# Patient Record
Sex: Female | Born: 1995 | Race: Black or African American | Hispanic: No | Marital: Single | Smoking: Current every day smoker
Health system: Southern US, Community
[De-identification: ages and names within clinical notes are randomized; demographics above are authoritative.]

## PROBLEM LIST (undated history)

## (undated) ENCOUNTER — Inpatient Hospital Stay (HOSPITAL_COMMUNITY): Payer: Self-pay

## (undated) DIAGNOSIS — I10 Essential (primary) hypertension: Secondary | ICD-10-CM

## (undated) DIAGNOSIS — O039 Complete or unspecified spontaneous abortion without complication: Secondary | ICD-10-CM

## (undated) DIAGNOSIS — Z8619 Personal history of other infectious and parasitic diseases: Secondary | ICD-10-CM

## (undated) DIAGNOSIS — O24419 Gestational diabetes mellitus in pregnancy, unspecified control: Secondary | ICD-10-CM

## (undated) DIAGNOSIS — D649 Anemia, unspecified: Secondary | ICD-10-CM

## (undated) HISTORY — PX: NO PAST SURGERIES: SHX2092

## (undated) HISTORY — PX: POLYPECTOMY: SHX149

## (undated) HISTORY — DX: Personal history of other infectious and parasitic diseases: Z86.19

---

## 2015-01-29 ENCOUNTER — Encounter (HOSPITAL_COMMUNITY): Payer: Self-pay | Admitting: Emergency Medicine

## 2015-01-29 ENCOUNTER — Emergency Department (HOSPITAL_COMMUNITY): Payer: Managed Care, Other (non HMO)

## 2015-01-29 ENCOUNTER — Emergency Department (HOSPITAL_COMMUNITY)
Admission: EM | Admit: 2015-01-29 | Discharge: 2015-01-29 | Disposition: A | Discharge status: Home / Self Care | Payer: Managed Care, Other (non HMO) | Attending: Emergency Medicine | Admitting: Emergency Medicine

## 2015-01-29 DIAGNOSIS — Z3A01 Less than 8 weeks gestation of pregnancy: Secondary | ICD-10-CM | POA: Diagnosis not present

## 2015-01-29 DIAGNOSIS — R011 Cardiac murmur, unspecified: Secondary | ICD-10-CM | POA: Insufficient documentation

## 2015-01-29 DIAGNOSIS — O200 Threatened abortion: Secondary | ICD-10-CM | POA: Diagnosis not present

## 2015-01-29 DIAGNOSIS — O209 Hemorrhage in early pregnancy, unspecified: Secondary | ICD-10-CM | POA: Diagnosis present

## 2015-01-29 DIAGNOSIS — N939 Abnormal uterine and vaginal bleeding, unspecified: Secondary | ICD-10-CM

## 2015-01-29 HISTORY — DX: Complete or unspecified spontaneous abortion without complication: O03.9

## 2015-01-29 LAB — OB RESULTS CONSOLE ABO/RH: RH TYPE: POSITIVE

## 2015-01-29 LAB — HCG, QUANTITATIVE, PREGNANCY: HCG, BETA CHAIN, QUANT, S: 10288 m[IU]/mL — AB (ref <5)

## 2015-01-29 LAB — CBC
HCT: 32.4 % — ABNORMAL LOW (ref 36.0–46.0)
Hemoglobin: 10 g/dL — ABNORMAL LOW (ref 12.0–15.0)
MCH: 21.5 pg — AB (ref 26.0–34.0)
MCHC: 30.9 g/dL (ref 30.0–36.0)
MCV: 69.7 fL — ABNORMAL LOW (ref 78.0–100.0)
Platelets: 373 10*3/uL (ref 150–400)
RBC: 4.65 MIL/uL (ref 3.87–5.11)
RDW: 17.9 % — ABNORMAL HIGH (ref 11.5–15.5)
WBC: 9.5 10*3/uL (ref 4.0–10.5)

## 2015-01-29 LAB — ABO/RH: ABO/RH(D): A POS

## 2015-01-29 LAB — URINALYSIS, ROUTINE W REFLEX MICROSCOPIC
Bilirubin Urine: NEGATIVE
GLUCOSE, UA: NEGATIVE mg/dL
KETONES UR: NEGATIVE mg/dL
Nitrite: NEGATIVE
PH: 5.5 (ref 5.0–8.0)
Protein, ur: NEGATIVE mg/dL
SPECIFIC GRAVITY, URINE: 1.028 (ref 1.005–1.030)
Urobilinogen, UA: 0.2 mg/dL (ref 0.0–1.0)

## 2015-01-29 LAB — OB RESULTS CONSOLE HIV ANTIBODY (ROUTINE TESTING): HIV: NONREACTIVE

## 2015-01-29 LAB — OB RESULTS CONSOLE HEPATITIS B SURFACE ANTIGEN: HEP B S AG: NEGATIVE

## 2015-01-29 LAB — WET PREP, GENITAL
Trich, Wet Prep: NONE SEEN
Yeast Wet Prep HPF POC: NONE SEEN

## 2015-01-29 LAB — POC URINE PREG, ED: Preg Test, Ur: POSITIVE — AB

## 2015-01-29 LAB — URINE MICROSCOPIC-ADD ON

## 2015-01-29 LAB — OB RESULTS CONSOLE GC/CHLAMYDIA
CHLAMYDIA, DNA PROBE: NEGATIVE
Gonorrhea: NEGATIVE

## 2015-01-29 LAB — OB RESULTS CONSOLE ANTIBODY SCREEN: ANTIBODY SCREEN: NEGATIVE

## 2015-01-29 LAB — OB RESULTS CONSOLE RUBELLA ANTIBODY, IGM: Rubella: IMMUNE

## 2015-01-29 LAB — OB RESULTS CONSOLE RPR: RPR: NONREACTIVE

## 2015-01-29 MED ORDER — NITROFURANTOIN MONOHYD MACRO 100 MG PO CAPS: 100.0000 mg | ORAL_CAPSULE | Freq: Two times a day (BID) | ORAL | Status: DC

## 2015-01-29 NOTE — ED Notes (Signed)
Called US no answer

## 2015-01-29 NOTE — ED Notes (Signed)
Patient transported to Ultrasound 

## 2015-01-29 NOTE — ED Provider Notes (Signed)
CSN: [id number]     Arrival date & time 01/29/15  1455 History  This chart was scribed for non-physician practitioner, Jinny Sanders, PA-C working with Arby Barrette, MD, by Abel Presto, ED Scribe. This patient was seen in room TR03C/TR03C and the patient's care was started at 5:34 PM.       Chief Complaint  Patient presents with  . Vaginal Bleeding     The history is provided by the patient. No language interpreter was used.   HPI Comments: Melinda Powell is a 19 y.o. female who presents to the Emergency Department complaining of vaginal bleeding. Pt reports she has been spotting for about 1 week. She Pt is G3 P0020. Pt states she had a miscarriage in November and states she experienced similar spotting at that time. Pt notes associated pelvic pressure. Pt has an appointment with a new OBGYN next month. Pt denies dysuria, difficulty urinating, bowel changes, fever, nausea, and vomiting, abdominal pain.     Past Medical History  Diagnosis Date  . Miscarriage    History reviewed. No pertinent past surgical history. No family history on file. History  Substance Use Topics  . Smoking status: Never Smoker   . Smokeless tobacco: Not on file  . Alcohol Use: No   OB History    No data available     Review of Systems  Constitutional: Negative for fever.  Gastrointestinal: Negative for nausea, vomiting, diarrhea and constipation.  Genitourinary: Positive for vaginal bleeding and pelvic pain (mild). Negative for dysuria, vaginal discharge and difficulty urinating.      Allergies  Review of patient's allergies indicates no known allergies.  Home Medications   Prior to Admission medications   Medication Sig Start Date End Date Taking? Authorizing Provider  Prenatal Vit-Fe Fumarate-FA (PRENATAL PO) Take 1 tablet by mouth daily.   Yes Historical Provider, MD  nitrofurantoin, macrocrystal-monohydrate, (MACROBID) 100 MG capsule Take 1 capsule (100 mg total) by mouth 2 (two)  times daily. 01/29/15   Ladona Mow, PA-C   BP 151/85 mmHg  Pulse 89  Temp(Src) 99 F (37.2 C) (Oral)  Resp 16  Ht  (1.676 m)  Wt 250 lb (113.399 kg)  BMI 40.37 kg/m2  SpO2 99%  LMP 12/12/2014 (Exact Date) Physical Exam  Constitutional: She is oriented to person, place, and time. She appears well-developed and well-nourished. No distress.  HENT:  Head: Normocephalic and atraumatic.  Mouth/Throat: Oropharynx is clear and moist. No oropharyngeal exudate.  Eyes: Conjunctivae are normal. Right eye exhibits no discharge. Left eye exhibits no discharge. No scleral icterus.  Neck: Normal range of motion. Neck supple.  Cardiovascular: Normal rate, regular rhythm and intact distal pulses.  Exam reveals no friction rub.   Murmur (systolic grade 2 best heard in left upper sternal border) heard. Pulmonary/Chest: Effort normal and breath sounds normal. No respiratory distress. She has no wheezes. She has no rales.  Abdominal: Soft. Normal appearance. There is no tenderness.  Genitourinary: No labial fusion. There is no rash, tenderness, lesion or injury on the right labia. There is no rash, tenderness, lesion or injury on the left labia. Cervix exhibits no motion tenderness, no discharge and no friability. Right adnexum displays no mass, no tenderness and no fullness. Left adnexum displays no mass, no tenderness and no fullness. No erythema or tenderness in the vagina. No foreign body around the vagina. No signs of injury around the vagina. No vaginal discharge found.  Scant amount of dried, dark red blood noted in vaginal vault.  Cervical os closed. No cervical motion tenderness, cervical friability or discharge. No active bleeding noted. Chaperone present during entire pelvic exam.  Musculoskeletal: Normal range of motion. She exhibits no edema or tenderness.  Neurological: She is alert and oriented to person, place, and time. No cranial nerve deficit. Coordination normal.  Skin: Skin is warm and  dry. No rash noted. She is not diaphoretic.  Psychiatric: She has a normal mood and affect. Her behavior is normal.  Nursing note and vitals reviewed.   ED Course  Procedures (including critical care time) DIAGNOSTIC STUDIES: Oxygen Saturation is 99% on room air, normal by my interpretation.    COORDINATION OF CARE: 5:39 PM Discussed treatment plan with patient at beside, the patient agrees with the plan and has no further questions at this time.   Labs Review Labs Reviewed  WET PREP, GENITAL - Abnormal; Notable for the following:    Clue Cells Wet Prep HPF POC FEW (*)    WBC, Wet Prep HPF POC FEW (*)    All other components within normal limits  HCG, QUANTITATIVE, PREGNANCY - Abnormal; Notable for the following:    hCG, Beta Chain, Quant, S 10288 (*)    All other components within normal limits  CBC - Abnormal; Notable for the following:    Hemoglobin 10.0 (*)    HCT 32.4 (*)    MCV 69.7 (*)    MCH 21.5 (*)    RDW 17.9 (*)    All other components within normal limits  URINALYSIS, ROUTINE W REFLEX MICROSCOPIC (NOT AT [facility]) - Abnormal; Notable for the following:    APPearance CLOUDY (*)    Hgb urine dipstick LARGE (*)    Leukocytes, UA MODERATE (*)    All other components within normal limits  URINE MICROSCOPIC-ADD ON - Abnormal; Notable for the following:    Squamous Epithelial / LPF MANY (*)    Bacteria, UA MANY (*)    Crystals CA OXALATE CRYSTALS (*)    All other components within normal limits  POC URINE PREG, ED - Abnormal; Notable for the following:    Preg Test, Ur POSITIVE (*)    All other components within normal limits  URINE CULTURE  ABO/RH  GC/CHLAMYDIA PROBE AMP ([place]) NOT AT [facility]    Imaging Review [place] Ob Comp Less 14 Wks  01/29/2015   CLINICAL DATA:  Vaginal bleeding. Six weeks and 6 days pregnant by last menstrual period. Obese patient.  EXAM: OBSTETRIC <14 WK US AND TRANSVAGINAL OB US  TECHNIQUE: Both transabdominal and transvaginal ultrasound  examinations were performed for complete evaluation of the gestation as well as the maternal uterus, adnexal regions, and pelvic cul-de-sac. Transvaginal technique was performed to assess early pregnancy.  COMPARISON:  None.  FINDINGS: Intrauterine gestational sac: Visualized/normal in shape.  Yolk sac:  Visualized/normal in shape.  Embryo:  Questionable tiny developing fetal pole.  Cardiac Activity: Questionable fetal cardiac activity.  Heart Rate: 113  bpm  MSD: 10.2  mm   5 w   5  d           US EDC: 09/26/2015  Maternal uterus/adnexae: No subchorionic hemorrhage. Normal appearing ovaries. Trace free peritoneal fluid.  IMPRESSION: Possible normal early intrauterine gestation with an estimated gestational age of [redacted] weeks and 5 days. Recommend follow-up quantitative B-HCG levels and follow-up US in 14 days to confirm and assess viability. This recommendation follows SRU consensus guidelines: Diagnostic Criteria for Nonviable Pregnancy Early in the First Trimester. N Engl J  Med 2013; 161:0960-45.   Electronically Signed   By: Beckie Salts M.D.   On: 01/29/2015 21:33   US Ob Transvaginal  01/29/2015   CLINICAL DATA:  Vaginal bleeding. Six weeks and 6 days pregnant by last menstrual period. Obese patient.  EXAM: OBSTETRIC <14 WK [place] AND TRANSVAGINAL OB US  TECHNIQUE: Both transabdominal and transvaginal ultrasound examinations were performed for complete evaluation of the gestation as well as the maternal uterus, adnexal regions, and pelvic cul-de-sac. Transvaginal technique was performed to assess early pregnancy.  COMPARISON:  None.  FINDINGS: Intrauterine gestational sac: Visualized/normal in shape.  Yolk sac:  Visualized/normal in shape.  Embryo:  Questionable tiny developing fetal pole.  Cardiac Activity: Questionable fetal cardiac activity.  Heart Rate: 113  bpm  MSD: 10.2  mm   5 w   5  d           [place] EDC: 09/26/2015  Maternal uterus/adnexae: No subchorionic hemorrhage. Normal appearing ovaries. Trace free  peritoneal fluid.  IMPRESSION: Possible normal early intrauterine gestation with an estimated gestational age of [redacted] weeks and 5 days. Recommend follow-up quantitative B-HCG levels and follow-up US in 14 days to confirm and assess viability. This recommendation follows SRU consensus guidelines: Diagnostic Criteria for Nonviable Pregnancy Early in the First Trimester. Malva Limes Med 2013; 409:8119-14.   Electronically Signed   By: Beckie Salts M.D.   On: 01/29/2015 21:33     EKG Interpretation None      MDM   Final diagnoses:  Threatened abortion in early pregnancy   Patient here with vaginal spotting and currently pregnant, patient estimating approximate 7 weeks. Pelvic exam is remarkable for some dried blood, however there is no active bleed. Patient is afebrile, well-appearing, hemodynamically stable and in no acute distress. Patient does not have any abdominal pain. There is no concern for surgical or acute abdomen. There is very low concern for ectopic pregnancy, however given patient's current pregnancy and having some reported vaginal bleeding, small mount of blood on exam, we'll follow up with ultrasound for rule out of ectopic pregnancy, evaluation for possible miscarriage.  Pelvic ultrasound with impression: Possible normal early intrauterine gestation with an estimated gestational age of [redacted] weeks and 5 days. Recommend follow-up quantitative B-HCG levels and follow-up US in 14 days to confirm and assess viability. This recommendation follows SRU consensus guidelines: Diagnostic Criteria for Nonviable Pregnancy Early in the First Trimester. Malva Limes Med 2013; 782:9562-13.  Given these findings, this does correlate well with patient's beta hCG at 10,000. With benign pelvic ultrasound, IUP noted, and patient remaining asymptomatic, and hemodynamically stable throughout her ER stay, believe patient is stable for discharge. Labs are unremarkable for acute pathology. Patient is noted to be  anemic with hemoglobin of 10. Patient states this is baseline for her. Asymptomatic bacteriuria noted, will send home patient with nitrofurantoin, also will send urine culture. Signs and symptoms consistent with a possible threatened miscarriage, discussed these findings with patient, and strongly recommended patient follow-up with OB/GYN in 14 days for follow-up ultrasound and beta hCG. Discussed return precautions with patient, patient verbalizes understanding and agreement of this plan.  I personally performed the services described in this documentation, which was scribed in my presence. The recorded information has been reviewed and is accurate.  BP 151/85 mmHg  Pulse 89  Temp(Src) 99 F (37.2 C) (Oral)  Resp 16  Ht 5\' 6"  (1.676 m)  Wt 250 lb (113.399 kg)  BMI 40.37 kg/m2  SpO2 99%  LMP 12/12/2014 (Exact Date)  Signed,  Ladona Mow, PA-C 12:23 AM  Patient discussed with Dr. Arby Barrette, M.D.    Ladona Mow, PA-C 01/30/15 0024  Arby Barrette, MD 01/30/15 [phone]

## 2015-01-29 NOTE — Discharge Instructions (Signed)
Threatened Miscarriage A threatened miscarriage occurs when you have vaginal bleeding during your first 20 weeks of pregnancy but the pregnancy has not ended. If you have vaginal bleeding during this time, your health care provider will do tests to make sure you are still pregnant. If the tests show you are still pregnant and the developing baby (fetus) inside your womb (uterus) is still growing, your condition is considered a threatened miscarriage. A threatened miscarriage does not mean your pregnancy will end, but it does increase the risk of losing your pregnancy (complete miscarriage). CAUSES  The cause of a threatened miscarriage is usually not known. If you go on to have a complete miscarriage, the most common cause is an abnormal number of chromosomes in the developing baby. Chromosomes are the structures inside cells that hold all your genetic material. Some causes of vaginal bleeding that do not result in miscarriage include:  Having sex.  Having an infection.  Normal hormone changes of pregnancy.  Bleeding that occurs when an egg implants in your uterus. RISK FACTORS Risk factors for bleeding in early pregnancy include:  Obesity.  Smoking.  Drinking excessive amounts of alcohol or caffeine.  Recreational drug use. SIGNS AND SYMPTOMS  Light vaginal bleeding.  Mild abdominal pain or cramps. DIAGNOSIS  If you have bleeding with or without abdominal pain before 20 weeks of pregnancy, your health care provider will do tests to check whether you are still pregnant. One important test involves using sound waves and a computer (ultrasound) to create images of the inside of your uterus. Other tests include an internal exam of your vagina and uterus (pelvic exam) and measurement of your baby's heart rate.  You may be diagnosed with a threatened miscarriage if:  Ultrasound testing shows you are still pregnant.  Your baby's heart rate is strong.  A pelvic exam shows that the  opening between your uterus and your vagina (cervix) is closed.  Your heart rate and blood pressure are stable.  Blood tests confirm you are still pregnant. TREATMENT  No treatments have been shown to prevent a threatened miscarriage from going on to a complete miscarriage. However, the right home care is important.  HOME CARE INSTRUCTIONS   Make sure you keep all your appointments for prenatal care. This is very important.  Get plenty of rest.  Do not have sex or use tampons if you have vaginal bleeding.  Do not douche.  Do not smoke or use recreational drugs.  Do not drink alcohol.  Avoid caffeine. SEEK MEDICAL CARE IF:  You have light vaginal bleeding or spotting while pregnant.  You have abdominal pain or cramping.  You have a fever. SEEK IMMEDIATE MEDICAL CARE IF:  You have heavy vaginal bleeding.  You have blood clots coming from your vagina.  You have severe low back pain or abdominal cramps.  You have fever, chills, and severe abdominal pain. MAKE SURE YOU:  Understand these instructions.  Will watch your condition.  Will get help right away if you are not doing well or get worse. Document Released: 08/22/2005 Document Revised: 08/27/2013 Document Reviewed: 06/18/2013 [facility] Patient Information 2015 [place], [place]. This information is not intended to replace advice given to you by your health care provider. Make sure you discuss any questions you have with your health care provider.   Abnormal Uterine Bleeding Abnormal uterine bleeding can affect women at various stages in life, including teenagers, women in their reproductive years, pregnant women, and women who have reached menopause. Several kinds  of uterine bleeding are considered abnormal, including:  Bleeding or spotting between periods.   Bleeding after sexual intercourse.   Bleeding that is heavier or more than normal.   Periods that last longer than usual.  Bleeding after menopause.   Many cases of abnormal uterine bleeding are minor and simple to treat, while others are more serious. Any type of abnormal bleeding should be evaluated by your health care provider. Treatment will depend on the cause of the bleeding. HOME CARE INSTRUCTIONS Monitor your condition for any changes. The following actions may help to alleviate any discomfort you are experiencing:  Avoid the use of tampons and douches as directed by your health care provider.  Change your pads frequently. You should get regular pelvic exams and Pap tests. Keep all follow-up appointments for diagnostic tests as directed by your health care provider.  SEEK MEDICAL CARE IF:   Your bleeding lasts more than 1 week.   You feel dizzy at times.  SEEK IMMEDIATE MEDICAL CARE IF:   You pass out.   You are changing pads every 15 to 30 minutes.   You have abdominal pain.  You have a fever.   You become sweaty or weak.   You are passing large blood clots from the vagina.   You start to feel nauseous and vomit. MAKE SURE YOU:   Understand these instructions.  Will watch your condition.  Will get help right away if you are not doing well or get worse. Document Released: 08/22/2005 Document Revised: 08/27/2013 Document Reviewed: 03/21/2013 [facility] Patient Information 2015 [place], [place]. This information is not intended to replace advice given to you by your health care provider. Make sure you discuss any questions you have with your health care provider.  Asymptomatic Bacteriuria Asymptomatic bacteriuria is the presence of a large number of bacteria in your urine without the usual symptoms of burning or frequent urination. The following conditions increase the risk of asymptomatic bacteriuria:  Diabetes mellitus.  Advanced age.  Pregnancy in the first trimester.  Kidney stones.  Kidney transplants.  Leaky kidney tube valve in young children (reflux). Treatment for this condition is not  needed in most people and can lead to other problems such as too much yeast and growth of resistant bacteria. However, some people, such as pregnant women, do need treatment to prevent kidney infection. Asymptomatic bacteriuria in pregnancy is also associated with fetal growth restriction, premature labor, and newborn death. HOME CARE INSTRUCTIONS Monitor your condition for any changes. The following actions may help to relieve any discomfort you are feeling:  Drink enough water and fluids to keep your urine clear or pale yellow. Go to the bathroom more often to keep your bladder empty.  Keep the area around your vagina and rectum clean. Wipe yourself from front to back after urinating. SEEK IMMEDIATE MEDICAL CARE IF:  You develop signs of an infection such as:  Burning with urination.  Frequency of voiding.  Back pain.  Fever.  You have blood in the urine.  You develop a fever. MAKE SURE YOU:  Understand these instructions.  Will watch your condition.  Will get help right away if you are not doing well or get worse. Document Released: 08/22/2005 Document Revised: 01/06/2014 Document Reviewed: 02/11/2013 [facility] Patient Information 2015 [place], [place]. This information is not intended to replace advice given to you by your health care provider. Make sure you discuss any questions you have with your health care provider.   Emergency Department Resource Guide 1) Find  a Doctor and Pay Out of Pocket Although you won't have to find out who is covered by your insurance plan, it is a good idea to ask around and get recommendations. You will then need to call the office and see if the doctor you have chosen will accept you as a new patient and what types of options they offer for patients who are self-pay. Some doctors offer discounts or will set up payment plans for their patients who do not have insurance, but you will need to ask so you aren't surprised when you get to your  appointment.  2) Contact Your Local Health Department Not all health departments have doctors that can see patients for sick visits, but many do, so it is worth a call to see if yours does. If you don't know where your local health department is, you can check in your phone book. The CDC also has a tool to help you locate your state's health department, and many state websites also have listings of all of their local health departments.  3) Find a Walk-in Clinic If your illness is not likely to be very severe or complicated, you may want to try a walk in clinic. These are popping up all over the country in pharmacies, drugstores, and shopping centers. They're usually staffed by nurse practitioners or physician assistants that have been trained to treat common illnesses and complaints. They're usually fairly quick and inexpensive. However, if you have serious medical issues or chronic medical problems, these are probably not your best option.  No Primary Care Doctor: - Call Health Connect at  [phone] - they can help you locate a primary care doctor that  accepts your insurance, provides certain services, etc. - Physician Referral Service- [phone]  Chronic Pain Problems: Organization         Address  Phone   Notes  [facility]  [phone] Patients need to be referred by their primary care doctor.   Medication Assistance: Organization         Address  Phone   Notes  [facility] Medication [facility] [address] [place] [place], [place] [address] [phone] --Must be a resident of [facility] -- Must have NO insurance coverage whatsoever (no Medicaid/ Medicare, etc.) -- The pt. MUST have a primary care doctor that directs their care regularly and follows them in the community   Med[facility]ist  [phone]   [employer]  [phone]    Agencies that provide inexpensive medical care: Organization         Address  Phone   Notes  [facility]  [phone]   [facility]    [phone]   [facility] [address] [place], [place] [address] [phone]   [facility] [address] [place] [place] [phone]   Planned Parenthood    [phone]   [facility]    [phone]   [facility] and [facility]  [address], [place] Phone:  [phone], Fax:  [phone] Hours of Operation:  9 am - 6 pm, M-F.  Also accepts Medicaid/Medicare and self-pay.  [facility]  [address], [place] Phone: [phone], Fax: [phone]. Hours of Operation:  8:30 am - 5:30 pm, M-F.  Also accepts Medicaid and self-pay.  [facility] [place] [address], [employer] Phone: (  [phone]   [facility] [address] [place], [place] [phone], Ext. [phone] Mondays & Thursdays: 7-9 AM.  First 15 patients are seen on a first come, first serve basis.    Medicaid-accepting [facility] Providers:  Organization         Address  Phone   Notes  [facility] [address], [place] [phone] Also accepts self-pay patients.  [facility] [address] Laurell Josephs [place], [place]  [phone]   [facility] [address], [place] [phone]   [facility] [address] [place] [phone]   Renaye Rakers [address], [place]   [phone] Only accepts [place] Access [place] patients after they have their name applied to their card.   Self-Pay (no insurance) in [facility]:  Organization         Address  Phone   Notes  Sickle Cell Patients, [facility] [address] [place], [place] [phone]   [facility] [address] [place], [place] [phone]   [facility]  [address], [place] [phone]   [facility]/Dr. Osei-Bonsu  [address] [place] or [address], [place] [phone] Phone number for both [place] and [place] locations is the same.  [facility] [address], [place] [phone]   [facility] [address] [place] or [address] [phone]   [facility] [address], [place] [phone], phone; [phone], fax Sees patients 1st and 3rd Saturday of every month.  Must not qualify for public or private insurance (i.e. Medicaid, Medicare, [place] Health Choice, Veterans' Benefits)  Household income should be no more than 200% of the poverty level The clinic cannot treat you if you are pregnant or think you are pregnant  Sexually transmitted diseases are not treated at the clinic.    Dental Care: Organization         Address  Phone  Notes  [facility] [address] [place], [place] [phone] Accepts children up to age 2 who are enrolled in [place] or [place] Health Choice; pregnant women with a Medicaid card; and children who have applied for Medicaid or [place] Health Choice, but were declined, whose parents can pay a reduced fee at time of service.  [facility]  [address], [place] [phone] Accepts children up to age 35 who are enrolled in [place] or [place] Health Choice; pregnant women with a Medicaid card; and children who have applied for Medicaid or  Health Choice, but were declined, whose parents can pay a reduced fee at time of service.  [facility]  [address] [place], [place] [phone] Patients are seen by appointment only. Walk-ins are not accepted. [facility] will see patients 55 years of age and  older. Monday - Tuesday (8am-5pm) Most Wednesdays (8:30-5pm) $30 per visit, cash only  [facility] Adult [facility] Access [facility]  [address], [facility] [phone] Patients are seen by appointment only. Walk-ins are not accepted. [facility] will see patients 96 years of age and older. One Wednesday Evening (Monthly: Volunteer Based).  $  30 per visit, cash only  [employer] [facility]  [phone] for adults; Children under age 604, call Graduate Pediatric Dentistry at [phone]. Children aged 374-14, please call [phone] to request a pediatric application.  Dental services are provided in all areas of dental care including fillings, crowns and bridges, complete and partial dentures, implants, gum treatment, root canals, and extractions. Preventive care is also provided. Treatment is provided to both adults and children. Patients are selected via a lottery and there is often a waiting list.   [facility] [address], [place]  [phone] [email or web address]   [facility] [address], [place], [place] [phone], Ext. [phone] Second and Fourth Thursday of each month, opens at 6:30 AM; Clinic ends at 9 AM.  Patients are seen on a first-come first-served basis, and a limited number are seen during each clinic.   [facility]  [address], [place], [place] [phone]   Eligibility Requirements You must have lived in [place], or [place] counties for at least the last three months.   You cannot be eligible for state or federal sponsored [place]healthcare insurance, including CIG[employer]eterans Administration, [place]icaid, or [employer]icare.   You generally cannot be eligible for healthcare insurance through your employer.    How to apply: Eligibility screenings are held every Tuesday and Wednesday afternoon from 1:00 pm until 4:00 pm. You do not need an appointment for the interview!  [facility] [address],  [place], [place] [phone]   [facility]  [phone]   [facility]  [phone]   [facility]  [phone]    Behavioral Health Resources in the Community: Intensive Outpatient Programs Organization         Address  Phone  Notes  [facility] [address], [place], [place] [phone]   [facility] [address], [place], [place] [phone]   [facility] [address], [place], [place]  [phone]   [facility] [address],  [place], [place] [phone] or [phone]   Substance Abuse Resources Organization         Address  Phone  Notes  [facility]  [phone]   [facility]  [phone]   [facility]  [phone]   [facility]  [phone]   Residential & Outpatient Substance Abuse Program  [phone]   Psychological Services Organization         Address  Phone  Notes  [facility]  [phone]- [phone]   [facility]  [phone]- [phone]   [facility] [address], [place] [phone] or [phone]    Mobile Crisis Teams Organization         Address  Phone  Notes  [facility], Mobile Crisis Care Unit  [phone]   [facility]  [address] [place], [place] [phone]   Doristine LocksSharon DeEsch [address] [place] [phone]    Self-Help/Support Groups Organization         Address  Phone             Notes  Mental Health Assoc. of  - variety of support groups  336- [phone] Call for more information  Narcotics Anonymous (NA), [facility] [address], [place]  2 meetings at this location   Statisticianesidential Treatment Programs Organization  Address  Phone  Notes  [facility] [address]    [place]  [phone]   [facility]  [address] [place] [address], [place], [place] [phone]   [facility] [address] [place], [place] [phone] Admissions: 8am-3pm M-F  [facility] [address],    [place], [place] [phone]   [facility] [address] [place], [place], [place] [phone]   [facility] [address],  [place], [place] [phone]   [facility] - Intensive Outpatient [address]ure[address] Josephs [address], [place], [place] [phone]   [facility] ([facility]iction [facility] [address] [place].,  [place], [place] [phone] or [phone]   [facility] ([facility]) [address] [place], [place] [phone] Accepts Medicaid  [facility] [address]  [place] [phone] Substance Abuse/Addiction Treatment   [facility] Organization         Address  Phone  Notes  [facility]  [phone]   Angie Fava, PhD [address] [place]vin Kn[place], [place]   [phone] or [phone]   [facility] Behavioral   [address] [place], [place] [phone]   [facility] [address] [place], [place] [phone] Insurance/Medicaid/sponsorship through [facility] and Families [address]                                    [place], [place] [phone] Therapy/tele-psych/case  [facility] [address] [place], [place] [phone]    Dr. Lolly Mustache  [phone]   [facility]  [employer] [facility] Dept. 1) [address] [place] 2) [address], [place] 3)  [address], [place] [phone]  [phone]   [facility] Child Abuse Hotline (717)[phone] or [phone] (After Hours)

## 2015-01-29 NOTE — ED Notes (Signed)
[facility] and [facility]ting 1 week ago-- has had a miscaraige in November-- not passing clots,

## 2015-01-30 LAB — GC/CHLAMYDIA PROBE AMP (~~LOC~~) NOT AT ARMC
Chlamydia: NEGATIVE
Neisseria Gonorrhea: NEGATIVE

## 2015-01-31 LAB — URINE CULTURE

## 2015-02-19 ENCOUNTER — Other Ambulatory Visit: Payer: Self-pay

## 2015-02-20 LAB — CYTOLOGY - PAP

## 2015-02-25 ENCOUNTER — Inpatient Hospital Stay (HOSPITAL_COMMUNITY)
Admission: AD | Admit: 2015-02-25 | Discharge: 2015-02-25 | Disposition: A | Discharge status: Home / Self Care | Payer: Managed Care, Other (non HMO) | Source: Ambulatory Visit | Attending: Obstetrics and Gynecology | Admitting: Obstetrics and Gynecology

## 2015-02-25 ENCOUNTER — Encounter (HOSPITAL_COMMUNITY): Payer: Self-pay | Admitting: *Deleted

## 2015-02-25 ENCOUNTER — Inpatient Hospital Stay (HOSPITAL_COMMUNITY): Payer: Managed Care, Other (non HMO)

## 2015-02-25 DIAGNOSIS — O209 Hemorrhage in early pregnancy, unspecified: Secondary | ICD-10-CM | POA: Diagnosis present

## 2015-02-25 DIAGNOSIS — Z87891 Personal history of nicotine dependence: Secondary | ICD-10-CM | POA: Diagnosis not present

## 2015-02-25 DIAGNOSIS — Z3A09 9 weeks gestation of pregnancy: Secondary | ICD-10-CM | POA: Insufficient documentation

## 2015-02-25 LAB — URINALYSIS, ROUTINE W REFLEX MICROSCOPIC
BILIRUBIN URINE: NEGATIVE
Glucose, UA: NEGATIVE mg/dL
Hgb urine dipstick: NEGATIVE
Ketones, ur: NEGATIVE mg/dL
Nitrite: NEGATIVE
PH: 6 (ref 5.0–8.0)
Protein, ur: NEGATIVE mg/dL
Urobilinogen, UA: 0.2 mg/dL (ref 0.0–1.0)

## 2015-02-25 LAB — POCT PREGNANCY, URINE: PREG TEST UR: POSITIVE — AB

## 2015-02-25 LAB — URINE MICROSCOPIC-ADD ON

## 2015-02-25 NOTE — MAU Provider Note (Signed)
History     CSN: [id number]  Arrival date and time: 02/25/15 1810   First Provider Initiated Contact with Patient 02/25/15 1853      Chief Complaint  Patient presents with  . Vaginal Bleeding   HPI Melinda Powell 19 y.o. [id number] @9  weeks presents to MAU with vaginal bleeding.  Bleeding started 4 days ago.  She has noticed it when she wipes herself and sometimes on the panty liner.  It is light red/pink.  No saturation of pad or liner.  She has quick, shocking pain located right mid abdomen, lasting a couple seconds and occurring once a day.  Last IC was 4 days ago just prior to bleeding.  She denies nausea, vomiting, fever, weakness, dysuria, LOF, SOB, CP.  She does have some lightheadedness that comes and goes.   She had NOB appt 6 days ago with Dr. Chestine Spore.  OB History    Gravida Para Term Preterm AB TAB SAB Ectopic Multiple Living   3    2 1 1    0      Past Medical History  Diagnosis Date  . Miscarriage     History reviewed. No pertinent past surgical history.  No family history on file.  History  Substance Use Topics  . Smoking status: Former Smoker    Quit date: 01/23/2015  . Smokeless tobacco: Never Used  . Alcohol Use: No    Allergies: No Known Allergies  Prescriptions prior to admission  Medication Sig Dispense Refill Last Dose  . Prenatal Vit-Fe Fumarate-FA (PRENATAL MULTIVITAMIN) TABS tablet Take 1 tablet by mouth daily at 12 noon.   02/25/2015 at Unknown time  . nitrofurantoin, macrocrystal-monohydrate, (MACROBID) 100 MG capsule Take 1 capsule (100 mg total) by mouth 2 (two) times daily. (Patient not taking: Reported on 02/25/2015) 10 capsule 0 Completed Course at Unknown time    ROS Pertinent ROS in HPI.  All other systems are negative.   Physical Exam   Blood pressure 158/85, pulse 101, temperature 98.7 F (37.1 C), temperature source Oral, resp. rate 18, height 5\' 6"  (1.676 m), weight 264 lb (119.75 kg), last menstrual period  12/12/2014.  Physical Exam  Constitutional: She is oriented to person, place, and time. She appears well-developed and well-nourished. No distress.  HENT:  Head: Normocephalic and atraumatic.  Eyes: EOM are normal.  Neck: Normal range of motion.  Cardiovascular: Normal rate and normal heart sounds.   Respiratory: Effort normal and breath sounds normal. No respiratory distress.  GI: Soft. There is no tenderness.  Genitourinary:  Small amt of thin white discharge present in vagina.  No blood noticed whatsoever.  No cuts or excoriations of genitalia.  NO CMT.  No adnexal mass or tenderness  Musculoskeletal: Normal range of motion.  Neurological: She is alert and oriented to person, place, and time.  Skin: Skin is warm and dry.  Psychiatric: She has a normal mood and affect.   Results for orders placed or performed during the hospital encounter of 02/25/15 (from the past 24 hour(s))  Urinalysis, Routine w reflex microscopic (not at [facility])     Status: Abnormal   Collection Time: 02/25/15  6:15 PM  Result Value Ref Range   Color, Urine YELLOW YELLOW   APPearance CLOUDY (A) CLEAR   Specific Gravity, Urine >1.030 (H) 1.005 - 1.030   pH 6.0 5.0 - 8.0   Glucose, UA NEGATIVE NEGATIVE mg/dL   Hgb urine dipstick NEGATIVE NEGATIVE   Bilirubin Urine NEGATIVE NEGATIVE   Ketones, ur NEGATIVE  NEGATIVE mg/dL   Protein, ur NEGATIVE NEGATIVE mg/dL   Urobilinogen, UA 0.2 0.0 - 1.0 mg/dL   Nitrite NEGATIVE NEGATIVE   Leukocytes, UA SMALL (A) NEGATIVE  Urine microscopic-add on     Status: Abnormal   Collection Time: 02/25/15  6:15 PM  Result Value Ref Range   Squamous Epithelial / LPF FEW (A) RARE   WBC, UA 0-2 <3 WBC/hpf   RBC / HPF 0-2 <3 RBC/hpf   Bacteria, UA RARE RARE  Pregnancy, urine POC     Status: Abnormal   Collection Time: 02/25/15  6:32 PM  Result Value Ref Range   Preg Test, Ur POSITIVE (A) NEGATIVE    MAU Course  Procedures  MDM Discussed with Dr. Tenny Craw.  She advises for U/S  to assess for viability.  Once confirmed - okay for discharge.  Pt to f/u in clinic.  In future - call clinic prior to coming to MAU.    Assessment and Plan  A: vaginal bleeding in preg  P: Discharge to home Pelvic rest F/u in clinic PRN/as scheduled Patient may return to MAU as needed or if her condition were to change or worsen   Bertram Denver 02/25/2015, 6:54 PM

## 2015-02-25 NOTE — Discharge Instructions (Signed)
Pelvic Rest °Pelvic rest is sometimes recommended for women when:  °· The placenta is partially or completely covering the opening of the cervix (placenta previa). °· There is bleeding between the uterine wall and the amniotic sac in the first trimester (subchorionic hemorrhage). °· The cervix begins to open without labor starting (incompetent cervix, cervical insufficiency). °· The labor is too early (preterm labor). °HOME CARE INSTRUCTIONS °· Do not have sexual intercourse, stimulation, or an orgasm. °· Do not use tampons, douche, or put anything in the vagina. °· Do not lift anything over 10 pounds (4.5 kg). °· Avoid strenuous activity or straining your pelvic muscles. °SEEK MEDICAL CARE IF:  °· You have any vaginal bleeding during pregnancy. Treat this as a potential emergency. °· You have cramping pain felt low in the stomach (stronger than menstrual cramps). °· You notice vaginal discharge (watery, mucus, or bloody). °· You have a low, dull backache. °· There are regular contractions or uterine tightening. °SEEK IMMEDIATE MEDICAL CARE IF: °You have vaginal bleeding and have placenta previa.  °Document Released: 12/17/2010 Document Revised: 11/14/2011 Document Reviewed: 12/17/2010 °ExitCare® Patient Information ©2015 [employer]. This information is not intended to replace advice given to you by your health care provider. Make sure you discuss any questions you have with your health care provider. ° °

## 2015-02-25 NOTE — MAU Note (Signed)
Pt says she has been spotting pinkish-red blood since this past Saturday. She thought it would resolve. Has mucous discharge since she found out she was pregnant. Denies pain today. Feels lightheaded since Sunday.

## 2015-03-02 ENCOUNTER — Inpatient Hospital Stay (HOSPITAL_COMMUNITY)
Admission: AD | Admit: 2015-03-02 | Discharge: 2015-03-02 | Disposition: A | Discharge status: Home / Self Care | Payer: Managed Care, Other (non HMO) | Source: Ambulatory Visit | Attending: Obstetrics and Gynecology | Admitting: Obstetrics and Gynecology

## 2015-03-02 ENCOUNTER — Encounter (HOSPITAL_COMMUNITY): Payer: Self-pay | Admitting: *Deleted

## 2015-03-02 DIAGNOSIS — B09 Unspecified viral infection characterized by skin and mucous membrane lesions: Secondary | ICD-10-CM | POA: Insufficient documentation

## 2015-03-02 DIAGNOSIS — O9989 Other specified diseases and conditions complicating pregnancy, childbirth and the puerperium: Secondary | ICD-10-CM | POA: Diagnosis not present

## 2015-03-02 DIAGNOSIS — Z87891 Personal history of nicotine dependence: Secondary | ICD-10-CM | POA: Diagnosis not present

## 2015-03-02 DIAGNOSIS — Z3A11 11 weeks gestation of pregnancy: Secondary | ICD-10-CM | POA: Diagnosis not present

## 2015-03-02 DIAGNOSIS — R21 Rash and other nonspecific skin eruption: Secondary | ICD-10-CM | POA: Diagnosis present

## 2015-03-02 LAB — URINALYSIS, ROUTINE W REFLEX MICROSCOPIC
Glucose, UA: NEGATIVE mg/dL
HGB URINE DIPSTICK: NEGATIVE
KETONES UR: 15 mg/dL — AB
LEUKOCYTES UA: NEGATIVE
NITRITE: NEGATIVE
PROTEIN: 100 mg/dL — AB
UROBILINOGEN UA: 0.2 mg/dL (ref 0.0–1.0)
pH: 5.5 (ref 5.0–8.0)

## 2015-03-02 MED ORDER — HYDROXYZINE HCL 25 MG PO TABS: 25.0000 mg | ORAL_TABLET | Freq: Four times a day (QID) | ORAL | Status: DC | PRN

## 2015-03-02 NOTE — MAU Provider Note (Signed)
History     CSN: [id number]  Arrival date and time: 03/02/15 4098   First Provider Initiated Contact with Patient 03/02/15 [phone]      Chief Complaint  Patient presents with  . Rash  . Fever   HPI   [name] is a 19 y.o. female G3P0020 at [redacted]w[redacted]d presenting to MAU with rash following a fever. On saturday she developed fever, chills; checked her temp which showed 103. She went to bed and woke up feeling fine Sunday morning. On Sunday she noticed a rash on her face, hands and feet. Her sister had the same rash on her hands and face; her sister lives with her. The rash is painful and and very itchy.   She has not tried anything over the counter for the rash.   OB History    Gravida Para Term Preterm AB TAB SAB Ectopic Multiple Living   0      Past Medical History  Diagnosis Date  . Miscarriage     History reviewed. No pertinent past surgical history.  History reviewed. No pertinent family history.  History  Substance Use Topics  . Smoking status: Former Smoker    Quit date: 01/23/2015  . Smokeless tobacco: Never Used  . Alcohol Use: No    Allergies: No Known Allergies  Prescriptions prior to admission  Medication Sig Dispense Refill Last Dose  . Prenatal Vit-Fe Fumarate-FA (PRENATAL MULTIVITAMIN) TABS tablet Take 1 tablet by mouth daily at 12 noon.   02/25/2015 at Unknown time   Results for orders placed or performed during the hospital encounter of 03/02/15 (from the past 72 hour(s))  Urinalysis, Routine w reflex microscopic (not at [facility])     Status: Abnormal   Collection Time: 03/02/15  8:00 AM  Result Value Ref Range   Color, Urine YELLOW YELLOW   APPearance CLOUDY (A) CLEAR   Specific Gravity, Urine >1.030 (H) 1.005 - 1.030   pH 5.5 5.0 - 8.0   Glucose, UA NEGATIVE NEGATIVE mg/dL   Hgb urine dipstick NEGATIVE NEGATIVE   Bilirubin Urine SMALL (A) NEGATIVE   Ketones, ur 15 (A) NEGATIVE mg/dL   Protein, ur 478 (A) NEGATIVE mg/dL   Urobilinogen, UA 0.2 0.0 - 1.0 mg/dL   Nitrite NEGATIVE NEGATIVE   Leukocytes, UA NEGATIVE NEGATIVE    Review of Systems  Constitutional: Negative for fever and chills.  Skin: Positive for itching and rash.  Neurological: Negative for tingling.   Physical Exam   Blood pressure 130/69, pulse 115, temperature 98.5 F (36.9 C), temperature source Oral, resp. rate 16, last menstrual period 12/12/2014.  Physical Exam  Constitutional: She is oriented to person, place, and time. She appears well-developed and well-nourished.  Non-toxic appearance. She does not have a sickly appearance. She does not appear ill. No distress.  HENT:  Head: Normocephalic.  Eyes: Pupils are equal, round, and reactive to light.  Neck: Neck supple.  Neurological: She is alert and oriented to person, place, and time.  Skin: Skin is warm and intact. Rash noted. She is not diaphoretic.     Blotchy, red, erythematous  rash scattered over face, palms of hands and one spot noted on left sole of foot.   Psychiatric: Her behavior is normal.    MAU Course  Procedures  None  MDM  Discussed patient with Dr. Tenny Craw  + fetal heart tones by doppler   Assessment and Plan   A:  1. Viral rash  P:  Discharge home in stable condition RX: hydroxyzine Follow up with Dr. Tenny Craw as scheduled Go to [facility] or [place] [facility] if rash worsens   Duane Lope, NP 03/03/2015  8:35 AM

## 2015-03-02 NOTE — MAU Note (Signed)
Pt presents to MAU with complaints of a rash on her hands, feet and face that started on Saturday following a fever. Reports she had a scant amount of vaginal bleeding on Saturday but none at this time

## 2015-03-03 ENCOUNTER — Emergency Department (HOSPITAL_COMMUNITY)
Admission: EM | Admit: 2015-03-03 | Discharge: 2015-03-04 | Disposition: A | Discharge status: Home / Self Care | Payer: Managed Care, Other (non HMO) | Attending: Emergency Medicine | Admitting: Emergency Medicine

## 2015-03-03 ENCOUNTER — Encounter (HOSPITAL_COMMUNITY): Payer: Self-pay | Admitting: Emergency Medicine

## 2015-03-03 DIAGNOSIS — B084 Enteroviral vesicular stomatitis with exanthem: Secondary | ICD-10-CM | POA: Diagnosis not present

## 2015-03-03 DIAGNOSIS — Z79899 Other long term (current) drug therapy: Secondary | ICD-10-CM | POA: Diagnosis not present

## 2015-03-03 DIAGNOSIS — Z87891 Personal history of nicotine dependence: Secondary | ICD-10-CM | POA: Insufficient documentation

## 2015-03-03 DIAGNOSIS — R21 Rash and other nonspecific skin eruption: Secondary | ICD-10-CM | POA: Diagnosis present

## 2015-03-03 DIAGNOSIS — J029 Acute pharyngitis, unspecified: Secondary | ICD-10-CM | POA: Insufficient documentation

## 2015-03-03 LAB — CBC WITH DIFFERENTIAL/PLATELET
BASOS ABS: 0 10*3/uL (ref 0.0–0.1)
Basophils Relative: 0 % (ref 0–1)
EOS PCT: 0 % (ref 0–5)
Eosinophils Absolute: 0 10*3/uL (ref 0.0–0.7)
HEMATOCRIT: 34.9 % — AB (ref 36.0–46.0)
Hemoglobin: 11.4 g/dL — ABNORMAL LOW (ref 12.0–15.0)
LYMPHS ABS: 1 10*3/uL (ref 0.7–4.0)
Lymphocytes Relative: 9 % — ABNORMAL LOW (ref 12–46)
MCH: 22.9 pg — ABNORMAL LOW (ref 26.0–34.0)
MCHC: 32.7 g/dL (ref 30.0–36.0)
MCV: 70.1 fL — AB (ref 78.0–100.0)
Monocytes Absolute: 0.5 10*3/uL (ref 0.1–1.0)
Monocytes Relative: 5 % (ref 3–12)
NEUTROS PCT: 85 % — AB (ref 43–77)
Neutro Abs: 9.2 10*3/uL — ABNORMAL HIGH (ref 1.7–7.7)
PLATELETS: 288 10*3/uL (ref 150–400)
RBC: 4.98 MIL/uL (ref 3.87–5.11)
RDW: 19.6 % — AB (ref 11.5–15.5)
WBC: 10.7 10*3/uL — ABNORMAL HIGH (ref 4.0–10.5)

## 2015-03-03 LAB — COMPREHENSIVE METABOLIC PANEL
ALT: 14 U/L (ref 14–54)
AST: 15 U/L (ref 15–41)
Albumin: 3.4 g/dL — ABNORMAL LOW (ref 3.5–5.0)
Alkaline Phosphatase: 46 U/L (ref 38–126)
Anion gap: 8 (ref 5–15)
BUN: 7 mg/dL (ref 6–20)
CO2: 22 mmol/L (ref 22–32)
Calcium: 9.1 mg/dL (ref 8.9–10.3)
Chloride: 98 mmol/L — ABNORMAL LOW (ref 101–111)
Creatinine, Ser: 0.67 mg/dL (ref 0.44–1.00)
GFR calc Af Amer: >60 mL/min (ref >60)
GFR calc non Af Amer: >60 mL/min (ref >60)
GLUCOSE: 101 mg/dL — AB (ref 65–99)
Potassium: 4 mmol/L (ref 3.5–5.1)
SODIUM: 128 mmol/L — AB (ref 135–145)
TOTAL PROTEIN: 7.2 g/dL (ref 6.5–8.1)
Total Bilirubin: 0.6 mg/dL (ref 0.3–1.2)

## 2015-03-03 MED ORDER — SODIUM CHLORIDE 0.9 % IV BOLUS (SEPSIS)
1000.0000 mL | Freq: Once | INTRAVENOUS | Status: AC
  Administered 2015-03-04: 1000 mL via INTRAVENOUS

## 2015-03-03 NOTE — ED Provider Notes (Signed)
CSN: [id number]     Arrival date & time 03/03/15  1954 History  This chart was scribed for Shon Batonourtney F Rahma Meller, MD by Annye AsaAnna Dorsett, ED Scribe. This patient was seen in room B15C/B15C and the patient's care was started at 11:55 PM.    Chief Complaint  Patient presents with  . Rash   The history is provided by the patient. No language interpreter was used.     HPI Comments: Melinda BalloonChristina Powell is [redacted] weeks pregnant a 19 y.o. female who presents to the Emergency Department complaining of 3 days of gradually worsening painful rash to face and bilateral hands and feet. Patient notes exposure to similar rash, "My little sister had a rash like this one, on her face, hands, and feet, but hers went away and mine's getting worse." She notes recent fever (at worst 103, presently 101) and sore throat that has improved at present. Patient [facility] ago and counseled to visit [facility] or [facility] ED if symptoms worsened. She denies chest pain, SOB, diarrhea, vomiting. She denies vaginal bleeding. abdominal pain or cramping. She denies recent tick exposures.   G3 P0 A2   Past Medical History  Diagnosis Date  . Miscarriage    History reviewed. No pertinent past surgical history. No family history on file. History  Substance Use Topics  . Smoking status: Former Smoker    Quit date: 01/23/2015  . Smokeless tobacco: Never Used  . Alcohol Use: No   OB History    Gravida Para Term Preterm AB TAB SAB Ectopic Multiple Living   3    2 1 1    0     Review of Systems  Constitutional: Positive for fever and chills.  HENT: Positive for sore throat.   Respiratory: Negative for cough, chest tightness and shortness of breath.   Cardiovascular: Negative for chest pain.  Gastrointestinal: Negative for nausea, vomiting and abdominal pain.  Genitourinary: Negative for dysuria.  Musculoskeletal: Positive for myalgias. Negative for back pain, neck pain and neck stiffness.  Skin: Positive for rash.  Negative for wound.  Neurological: Negative for headaches.  All other systems reviewed and are negative.     Allergies  Review of patient's allergies indicates no known allergies.  Home Medications   Prior to Admission medications   Medication Sig Start Date End Date Taking? Authorizing Provider  diphenhydrAMINE (SOMINEX) 25 MG tablet Take 25 mg by mouth at bedtime as needed for itching or sleep.   Yes Historical Provider, MD  hydrOXYzine (ATARAX/VISTARIL) 25 MG tablet Take 1 tablet (25 mg total) by mouth every 6 (six) hours as needed for itching. 03/02/15  Yes Harolyn RutherfordJennifer I Rasch, NP  IRON PO Take 1 tablet by mouth daily.   Yes Historical Provider, MD  Prenatal Vit-Fe Fumarate-FA (PRENATAL MULTIVITAMIN) TABS tablet Take 1 tablet by mouth daily at 12 noon.   Yes Historical Provider, MD  HYDROcodone-acetaminophen (NORCO/VICODIN) 5-325 MG per tablet Take 1 tablet by mouth every 6 (six) hours as needed for moderate pain. 03/04/15   Shon Batonourtney F Ygnacio Fecteau, MD   BP 136/78 mmHg  Pulse 110  Temp(Src) 101 F (38.3 C) (Oral)  Resp 20  SpO2 99%  LMP 12/12/2014 (Exact Date) Physical Exam  Constitutional: She is oriented to person, place, and time. She appears well-developed and well-nourished. No distress.  HENT:  Head: Normocephalic and atraumatic.  Mouth/Throat: Oropharynx is clear and moist.  No oral lesions noted  Cardiovascular: Normal rate, regular rhythm and normal heart sounds.  No murmur heard. Pulmonary/Chest: Effort normal and breath sounds normal. No respiratory distress. She has no wheezes.  Abdominal: Soft. Bowel sounds are normal. There is no tenderness. There is no rebound.  Neurological: She is alert and oriented to person, place, and time.  Skin: Skin is warm and dry.  Blanching macular/papular rash over the palms and soles, no evidence of petechiae, patient also has rash over the face involving the bilateral cheeks, nose, and perioral region, no intraoral lesions noted   Psychiatric: She has a normal mood and affect.  Nursing note and vitals reviewed.   ED Course  Procedures   DIAGNOSTIC STUDIES: Oxygen Saturation is 97% on RA, adequate by my interpretation.    COORDINATION OF CARE: 12:03 AM Discussed treatment plan with pt at bedside and pt agreed to plan.   Labs Review Labs Reviewed  CBC WITH DIFFERENTIAL/PLATELET - Abnormal; Notable for the following:    WBC 10.7 (*)    Hemoglobin 11.4 (*)    HCT 34.9 (*)    MCV 70.1 (*)    MCH 22.9 (*)    RDW 19.6 (*)    Neutrophils Relative % 85 (*)    Neutro Abs 9.2 (*)    Lymphocytes Relative 9 (*)    All other components within normal limits  COMPREHENSIVE METABOLIC PANEL - Abnormal; Notable for the following:    Sodium 128 (*)    Chloride 98 (*)    Glucose, Bld 101 (*)    Albumin 3.4 (*)    All other components within normal limits    Imaging Review No results found.   EKG Interpretation None      MDM   Final diagnoses:  Hand, foot and mouth disease   Patient presents with rash, fever, and sore throat. Febrile 101 in triage and mildly tachycardic. Lab work notable for hyponatremia to 128 and mild leukocytosis. Patient reports that she's decreased by mouth intake. Patient given normal saline bolus. Physical exam is most consistent with hand-foot-and-mouth disease with minimal oral involvement. Patient denies any tick exposures and rash is not classic for RMSF. Patient given Norco for pain. Given that she is pregnant, she is not a candidate for NSAIDs. Discussed with patient that this was a viral illness and would need to run its course. Supportive care at home. She is to follow-up closely with her OB.  After history, exam, and medical workup I feel the patient has been appropriately medically screened and is safe for discharge home. Pertinent diagnoses were discussed with the patient. Patient was given return precautions.  I personally performed the services described in this  documentation, which was scribed in my presence. The recorded information has been reviewed and is accurate.      Shon Baton, MD 03/04/15 [phone]

## 2015-03-03 NOTE — ED Notes (Signed)
Pt. reports persistent generalized itchy rashes onset this weekend [facility] diagnosed with viral rash , respirations unlabored . Pt. Is [redacted] weeks pregnant . Denies fever or chills.

## 2015-03-04 DIAGNOSIS — B084 Enteroviral vesicular stomatitis with exanthem: Secondary | ICD-10-CM | POA: Diagnosis not present

## 2015-03-04 MED ORDER — HYDROCODONE-ACETAMINOPHEN 5-325 MG PO TABS
1.0000 | ORAL_TABLET | Freq: Once | ORAL | Status: AC
  Administered 2015-03-04: 1 via ORAL
  Filled 2015-03-04: qty 1

## 2015-03-04 MED ORDER — HYDROCODONE-ACETAMINOPHEN 5-325 MG PO TABS: 1.0000 | ORAL_TABLET | Freq: Four times a day (QID) | ORAL | Status: DC | PRN

## 2015-03-04 NOTE — Discharge Instructions (Signed)
You were seen today and diagnosed with hand-foot-and-mouth disease. This is a viral illness that is not treated with antibiotics. It is with symptomatic support. You'll be given a short course of pain medication because you cannot take anti-inflammatory medications because you are pregnant. You need follow-up with her OB closely.  Hand, Foot, and Mouth Disease Hand, foot, and mouth disease is a common viral illness. It occurs mainly in children younger than 21 years of age, but adolescents and adults may also get it. This disease is different than foot and mouth disease that cattle, sheep, and pigs get. Most people are better in 1 week. CAUSES  Hand, foot, and mouth disease is usually caused by a group of viruses called enteroviruses. Hand, foot, and mouth disease can spread from person to person (contagious). A person is most contagious during the first week of the illness. It is not transmitted to or from pets or other animals. It is most common in the summer and early fall. Infection is spread from person to person by direct contact with an infected person's:  Nose discharge.  Throat discharge.  Stool. SYMPTOMS  Open sores (ulcers) occur in the mouth. Symptoms may also include:  A rash on the hands and feet, and occasionally the buttocks.  Fever.  Aches.  Pain from the mouth ulcers. DIAGNOSIS  Hand, foot, and mouth disease is one of many infections that cause mouth sores. Additional tests are not usually needed. TREATMENT  Nearly all patients recover without medical treatment in 7 to 10 days. There are no common complications. Your child should only take over-the-counter or prescription medicines for pain, discomfort, or fever as directed by your caregiver. Your caregiver may recommend the use of an over-the-counter antacid or a combination of an antacid and diphenhydramine to help coat the lesions in the mouth and improve symptoms.  HOME CARE INSTRUCTIONS  Try combinations of foods to  see what you will tolerate and aim for a balanced diet. Soft foods may be easier to swallow. The mouth sores from hand, foot, and mouth disease typically hurt and are painful when exposed to salty, spicy, or acidic food or drinks.  Milk and cold drinks are soothing for some patients. Milk shakes, frozen ice pops, slushies, and sherberts are usually well tolerated.  Sport drinks are good choices for hydration, and they also provide a few calories. Often, a child with hand, foot, and mouth disease will be able to drink without discomfort.   For younger children and infants, feeding with a cup, spoon, or syringe may be less painful than drinking through the nipple of a bottle.  Keep children out of childcare programs, schools, or other group settings during the first few days of the illness or until they are without fever. The sores on the body are not contagious. SEEK IMMEDIATE MEDICAL CARE IF:  You  develop signs of dehydration such as:  Decreased urination.  Dry mouth, tongue, or lips.  Decreased tears or sunken eyes.  Dry skin.  Rapid breathing.  Fussy behavior.  Poor color or pale skin.  Fingertips taking longer than 2 seconds to turn pink after a gentle squeeze.  Rapid weight loss.  You develops a severe headache, stiff neck, or change in behavior.  You develops ulcers or blisters that occur on the lips or outside of the mouth. Document Released: 05/21/2003 Document Revised: 11/14/2011 Document Reviewed: 02/03/2011 [facility] Patient Information 2015 [place]Care, [place]. This information is not intended to replace advice given to you by your  health care provider. Make sure you discuss any questions you have with your health care provider. ° °

## 2015-04-30 ENCOUNTER — Ambulatory Visit (HOSPITAL_COMMUNITY)
Admission: RE | Admit: 2015-04-30 | Discharge: 2015-04-30 | Disposition: A | Discharge status: Home / Self Care | Payer: Managed Care, Other (non HMO) | Source: Ambulatory Visit | Attending: Obstetrics | Admitting: Obstetrics

## 2015-04-30 ENCOUNTER — Encounter: Payer: Managed Care, Other (non HMO) | Attending: Obstetrics and Gynecology | Admitting: *Deleted

## 2015-04-30 DIAGNOSIS — O24419 Gestational diabetes mellitus in pregnancy, unspecified control: Secondary | ICD-10-CM

## 2015-04-30 DIAGNOSIS — Z713 Dietary counseling and surveillance: Secondary | ICD-10-CM | POA: Diagnosis not present

## 2015-04-30 NOTE — Progress Notes (Signed)
  Patient was seen on 04/30/15 for Gestational Diabetes self-management . The following learning objectives were met by the patient :   States the definition of Gestational Diabetes  States why dietary management is important in controlling blood glucose  Describes the effects of carbohydrates on blood glucose levels  Demonstrates ability to create a balanced meal plan  Demonstrates carbohydrate counting   States when to check blood glucose levels  Demonstrates proper blood glucose monitoring techniques  States the effect of stress and exercise on blood glucose levels  States the importance of limiting caffeine and abstaining from alcohol and smoking  Plan:  Aim for 2 Carb Choices per meal (30 grams) +/- 1 either way for breakfast Aim for 3 Carb Choices per meal (45 grams) +/- 1 either way from lunch and dinner Aim for 1-2 Carbs per snack Begin reading food labels for Total Carbohydrate and sugar grams of foods Consider  increasing your activity level by walking daily as tolerated Begin checking BG before breakfast and 2 hours after first bit of breakfast, lunch and dinner after  as directed by MD  Take medication  as directed by MD  Blood glucose monitor given:accu-chek aviva connect Lot#: [id number] Exp: 02/03/16 90 min pp [place] sub $RemoveBe'131mg'CSLWZPBOT$ /dl  Patient instructed to monitor glucose levels: FBS: 60 - <90 2 hour: <120  Patient received the following handouts:  Nutrition Diabetes and Pregnancy  Carbohydrate Counting List  Meal Planning worksheet  Patient will be seen for follow-up as needed.

## 2015-05-04 ENCOUNTER — Other Ambulatory Visit (HOSPITAL_COMMUNITY): Payer: Self-pay | Admitting: Obstetrics

## 2015-08-17 ENCOUNTER — Inpatient Hospital Stay (HOSPITAL_COMMUNITY): Payer: Managed Care, Other (non HMO)

## 2015-08-17 ENCOUNTER — Encounter (HOSPITAL_COMMUNITY): Payer: Self-pay | Admitting: *Deleted

## 2015-08-17 ENCOUNTER — Inpatient Hospital Stay (HOSPITAL_COMMUNITY)
Admission: AD | Admit: 2015-08-17 | Discharge: 2015-08-17 | Disposition: A | Discharge status: Home / Self Care | Payer: Managed Care, Other (non HMO) | Source: Ambulatory Visit | Attending: Obstetrics and Gynecology | Admitting: Obstetrics and Gynecology

## 2015-08-17 DIAGNOSIS — Z87891 Personal history of nicotine dependence: Secondary | ICD-10-CM | POA: Insufficient documentation

## 2015-08-17 DIAGNOSIS — Z3A35 35 weeks gestation of pregnancy: Secondary | ICD-10-CM

## 2015-08-17 DIAGNOSIS — O289 Unspecified abnormal findings on antenatal screening of mother: Secondary | ICD-10-CM | POA: Diagnosis not present

## 2015-08-17 DIAGNOSIS — O24415 Gestational diabetes mellitus in pregnancy, controlled by oral hypoglycemic drugs: Secondary | ICD-10-CM | POA: Insufficient documentation

## 2015-08-17 DIAGNOSIS — O288 Other abnormal findings on antenatal screening of mother: Secondary | ICD-10-CM

## 2015-08-17 DIAGNOSIS — Z3689 Encounter for other specified antenatal screening: Secondary | ICD-10-CM

## 2015-08-17 DIAGNOSIS — O99213 Obesity complicating pregnancy, third trimester: Secondary | ICD-10-CM

## 2015-08-17 HISTORY — DX: Anemia, unspecified: D64.9

## 2015-08-17 HISTORY — DX: Gestational diabetes mellitus in pregnancy, unspecified control: O24.419

## 2015-08-17 NOTE — Discharge Instructions (Signed)
Fetal Movement Counts  Patient Name: __________________________________________________ Patient Due Date: ____________________  Performing a fetal movement count is highly recommended in high-risk pregnancies, but it is good for every pregnant woman to do. Your health care provider may ask you to start counting fetal movements at 28 weeks of the pregnancy. Fetal movements often increase:  · After eating a full meal.  · After physical activity.  · After eating or drinking something sweet or cold.  · At rest.  Pay attention to when you feel the baby is most active. This will help you notice a pattern of your baby's sleep and wake cycles and what factors contribute to an increase in fetal movement. It is important to perform a fetal movement count at the same time each day when your baby is normally most active.   HOW TO COUNT FETAL MOVEMENTS  1. Find a quiet and comfortable area to sit or lie down on your left side. Lying on your left side provides the best blood and oxygen circulation to your baby.  2. Write down the day and time on a sheet of paper or in a journal.  3. Start counting kicks, flutters, swishes, rolls, or jabs in a 2-hour period. You should feel at least 10 movements within 2 hours.  4. If you do not feel 10 movements in 2 hours, wait 2-3 hours and count again. Look for a change in the pattern or not enough counts in 2 hours.  SEEK MEDICAL CARE IF:  · You feel less than 10 counts in 2 hours, tried twice.  · There is no movement in over an hour.  · The pattern is changing or taking longer each day to reach 10 counts in 2 hours.  · You feel the baby is not moving as he or she usually does.  Date: ____________ Movements: ____________ Start time: ____________ Finish time: ____________   Date: ____________ Movements: ____________ Start time: ____________ Finish time: ____________  Date: ____________ Movements: ____________ Start time: ____________ Finish time: ____________  Date: ____________ Movements:  ____________ Start time: ____________ Finish time: ____________  Date: ____________ Movements: ____________ Start time: ____________ Finish time: ____________  Date: ____________ Movements: ____________ Start time: ____________ Finish time: ____________  Date: ____________ Movements: ____________ Start time: ____________ Finish time: ____________  Date: ____________ Movements: ____________ Start time: ____________ Finish time: ____________   Date: ____________ Movements: ____________ Start time: ____________ Finish time: ____________  Date: ____________ Movements: ____________ Start time: ____________ Finish time: ____________  Date: ____________ Movements: ____________ Start time: ____________ Finish time: ____________  Date: ____________ Movements: ____________ Start time: ____________ Finish time: ____________  Date: ____________ Movements: ____________ Start time: ____________ Finish time: ____________  Date: ____________ Movements: ____________ Start time: ____________ Finish time: ____________  Date: ____________ Movements: ____________ Start time: ____________ Finish time: ____________   Date: ____________ Movements: ____________ Start time: ____________ Finish time: ____________  Date: ____________ Movements: ____________ Start time: ____________ Finish time: ____________  Date: ____________ Movements: ____________ Start time: ____________ Finish time: ____________  Date: ____________ Movements: ____________ Start time: ____________ Finish time: ____________  Date: ____________ Movements: ____________ Start time: ____________ Finish time: ____________  Date: ____________ Movements: ____________ Start time: ____________ Finish time: ____________  Date: ____________ Movements: ____________ Start time: ____________ Finish time: ____________   Date: ____________ Movements: ____________ Start time: ____________ Finish time: ____________  Date: ____________ Movements: ____________ Start time: ____________ Finish  time: ____________  Date: ____________ Movements: ____________ Start time: ____________ Finish time: ____________  Date: ____________ Movements: ____________ Start time:   ____________ Finish time: ____________  Date: ____________ Movements: ____________ Start time: ____________ Finish time: ____________  Date: ____________ Movements: ____________ Start time: ____________ Finish time: ____________  Date: ____________ Movements: ____________ Start time: ____________ Finish time: ____________   Date: ____________ Movements: ____________ Start time: ____________ Finish time: ____________  Date: ____________ Movements: ____________ Start time: ____________ Finish time: ____________  Date: ____________ Movements: ____________ Start time: ____________ Finish time: ____________  Date: ____________ Movements: ____________ Start time: ____________ Finish time: ____________  Date: ____________ Movements: ____________ Start time: ____________ Finish time: ____________  Date: ____________ Movements: ____________ Start time: ____________ Finish time: ____________  Date: ____________ Movements: ____________ Start time: ____________ Finish time: ____________   Date: ____________ Movements: ____________ Start time: ____________ Finish time: ____________  Date: ____________ Movements: ____________ Start time: ____________ Finish time: ____________  Date: ____________ Movements: ____________ Start time: ____________ Finish time: ____________  Date: ____________ Movements: ____________ Start time: ____________ Finish time: ____________  Date: ____________ Movements: ____________ Start time: ____________ Finish time: ____________  Date: ____________ Movements: ____________ Start time: ____________ Finish time: ____________  Date: ____________ Movements: ____________ Start time: ____________ Finish time: ____________   Date: ____________ Movements: ____________ Start time: ____________ Finish time: ____________  Date: ____________  Movements: ____________ Start time: ____________ Finish time: ____________  Date: ____________ Movements: ____________ Start time: ____________ Finish time: ____________  Date: ____________ Movements: ____________ Start time: ____________ Finish time: ____________  Date: ____________ Movements: ____________ Start time: ____________ Finish time: ____________  Date: ____________ Movements: ____________ Start time: ____________ Finish time: ____________  Date: ____________ Movements: ____________ Start time: ____________ Finish time: ____________   Date: ____________ Movements: ____________ Start time: ____________ Finish time: ____________  Date: ____________ Movements: ____________ Start time: ____________ Finish time: ____________  Date: ____________ Movements: ____________ Start time: ____________ Finish time: ____________  Date: ____________ Movements: ____________ Start time: ____________ Finish time: ____________  Date: ____________ Movements: ____________ Start time: ____________ Finish time: ____________  Date: ____________ Movements: ____________ Start time: ____________ Finish time: ____________     This information is not intended to replace advice given to you by your health care provider. Make sure you discuss any questions you have with your health care provider.     Document Released: 09/21/2006 Document Revised: 09/12/2014 Document Reviewed: 06/18/2012  Elsevier Interactive Patient Education ©2016 [employer]

## 2015-08-17 NOTE — MAU Provider Note (Signed)
  History     CSN: [id number]  Arrival date and time: 08/17/15 1719   First Provider Initiated Contact with Patient 08/17/15 1829      Chief Complaint  Patient presents with  . failed NST    HPI  Melinda Powell 19 y.o. [id number] @ 2853w3d with gestational diabetes requiring glyburide presents from the office after having a non reactive non-stress test. She denies any vaginal bleeding. LOF, contractions.   Past Medical History  Diagnosis Date  . Miscarriage   . Anemia   . Gestational diabetes     Past Surgical History  Procedure Laterality Date  . No past surgeries      Family History  Problem Relation Age of Onset  . Diabetes Mother   . Diabetes Father     Social History  Substance Use Topics  . Smoking status: Former Smoker    Types: Cigarettes    Quit date: 01/23/2015  . Smokeless tobacco: Never Used  . Alcohol Use: No    Allergies: No Known Allergies  Prescriptions prior to admission  Medication Sig Dispense Refill Last Dose  . glyBURIDE (DIABETA) 2.5 MG tablet Take 2.5 mg by mouth daily with breakfast.   08/17/2015 at Unknown time  . IRON PO Take 1 tablet by mouth daily.   08/17/2015 at Unknown time  . Prenatal Vit-Fe Fumarate-FA (PRENATAL MULTIVITAMIN) TABS tablet Take 1 tablet by mouth daily at 12 noon.   08/17/2015 at Unknown time  . HYDROcodone-acetaminophen (NORCO/VICODIN) 5-325 MG per tablet Take 1 tablet by mouth every 6 (six) hours as needed for moderate pain. (Patient not taking: Reported on 08/17/2015) 10 tablet 0 Not Taking at Unknown time  . hydrOXYzine (ATARAX/VISTARIL) 25 MG tablet Take 1 tablet (25 mg total) by mouth every 6 (six) hours as needed for itching. (Patient not taking: Reported on 08/17/2015) 12 tablet 0 Not Taking at Unknown time    Review of Systems  Constitutional: Negative for fever.  All other systems reviewed and are negative.  Physical Exam   Blood pressure 130/82, pulse 96, temperature 97.8 F (36.6 C), temperature  source Oral, resp. rate 18, last menstrual period 12/12/2014.  Physical Exam  Nursing note and vitals reviewed. Constitutional: She is oriented to person, place, and time. She appears well-developed and well-nourished. No distress.  HENT:  Head: Normocephalic and atraumatic.  Cardiovascular: Normal rate and regular rhythm.   Respiratory: Effort normal and breath sounds normal. No respiratory distress.  GI: Soft. There is no tenderness.  Musculoskeletal: Normal range of motion.  Neurological: She is alert and oriented to person, place, and time.  Skin: Skin is warm and dry.  Psychiatric: She has a normal mood and affect. Her behavior is normal. Judgment and thought content normal.    MAU Course  Procedures  MDM Equivocal NST; Consult with Dr Dareen Pianoanderson and will send pt for BPP. She is having uterine irritability and occasional contractions. Bpp 8/8  Assessment and Plan  Non reactive NST Gestational Diabetic Discharge to home   [facility] Hospital[facility]mmons,Lori Grissett 08/17/2015, 7:20 PM

## 2015-08-17 NOTE — MAU Note (Signed)
Was at dr's for NST (routine for gest diab). Sent over for further evaluation and monitoring.

## 2015-08-24 ENCOUNTER — Other Ambulatory Visit: Payer: Self-pay | Admitting: Obstetrics

## 2015-08-24 LAB — OB RESULTS CONSOLE GBS: GBS: POSITIVE

## 2015-08-27 ENCOUNTER — Inpatient Hospital Stay (HOSPITAL_COMMUNITY)
Admission: AD | Admit: 2015-08-27 | Discharge: 2015-08-27 | Disposition: A | Discharge status: Home / Self Care | Payer: Managed Care, Other (non HMO) | Source: Ambulatory Visit | Attending: Obstetrics and Gynecology | Admitting: Obstetrics and Gynecology

## 2015-08-27 ENCOUNTER — Encounter (HOSPITAL_COMMUNITY): Payer: Self-pay | Admitting: *Deleted

## 2015-08-27 ENCOUNTER — Inpatient Hospital Stay (HOSPITAL_COMMUNITY): Payer: Managed Care, Other (non HMO)

## 2015-08-27 DIAGNOSIS — O24415 Gestational diabetes mellitus in pregnancy, controlled by oral hypoglycemic drugs: Secondary | ICD-10-CM | POA: Insufficient documentation

## 2015-08-27 DIAGNOSIS — Z87891 Personal history of nicotine dependence: Secondary | ICD-10-CM | POA: Insufficient documentation

## 2015-08-27 DIAGNOSIS — O10913 Unspecified pre-existing hypertension complicating pregnancy, third trimester: Secondary | ICD-10-CM | POA: Diagnosis not present

## 2015-08-27 DIAGNOSIS — Z3689 Encounter for other specified antenatal screening: Secondary | ICD-10-CM

## 2015-08-27 DIAGNOSIS — Z3A36 36 weeks gestation of pregnancy: Secondary | ICD-10-CM | POA: Insufficient documentation

## 2015-08-27 DIAGNOSIS — O368190 Decreased fetal movements, unspecified trimester, not applicable or unspecified: Secondary | ICD-10-CM | POA: Diagnosis not present

## 2015-08-27 DIAGNOSIS — O368130 Decreased fetal movements, third trimester, not applicable or unspecified: Secondary | ICD-10-CM | POA: Diagnosis present

## 2015-08-27 HISTORY — DX: Essential (primary) hypertension: I10

## 2015-08-27 LAB — URINALYSIS, ROUTINE W REFLEX MICROSCOPIC
Bilirubin Urine: NEGATIVE
Glucose, UA: 500 mg/dL — AB
Hgb urine dipstick: NEGATIVE
Ketones, ur: NEGATIVE mg/dL
LEUKOCYTES UA: NEGATIVE
Nitrite: NEGATIVE
PROTEIN: NEGATIVE mg/dL
pH: 6 (ref 5.0–8.0)

## 2015-08-27 LAB — CBC
HEMATOCRIT: 35.9 % — AB (ref 36.0–46.0)
HEMOGLOBIN: 11.8 g/dL — AB (ref 12.0–15.0)
MCH: 26.8 pg (ref 26.0–34.0)
MCHC: 32.9 g/dL (ref 30.0–36.0)
MCV: 81.4 fL (ref 78.0–100.0)
Platelets: 236 10*3/uL (ref 150–400)
RBC: 4.41 MIL/uL (ref 3.87–5.11)
RDW: 15.4 % (ref 11.5–15.5)
WBC: 7.7 10*3/uL (ref 4.0–10.5)

## 2015-08-27 LAB — COMPREHENSIVE METABOLIC PANEL
ALBUMIN: 2.8 g/dL — AB (ref 3.5–5.0)
ALK PHOS: 89 U/L (ref 38–126)
ALT: 12 U/L — ABNORMAL LOW (ref 14–54)
AST: 14 U/L — ABNORMAL LOW (ref 15–41)
Anion gap: 7 (ref 5–15)
BILIRUBIN TOTAL: 0.5 mg/dL (ref 0.3–1.2)
BUN: 9 mg/dL (ref 6–20)
CALCIUM: 9.4 mg/dL (ref 8.9–10.3)
CO2: 23 mmol/L (ref 22–32)
Chloride: 105 mmol/L (ref 101–111)
Creatinine, Ser: 0.43 mg/dL — ABNORMAL LOW (ref 0.44–1.00)
GFR calc Af Amer: >60 mL/min (ref >60)
GFR calc non Af Amer: >60 mL/min (ref >60)
Glucose, Bld: 94 mg/dL (ref 65–99)
Potassium: 4.2 mmol/L (ref 3.5–5.1)
Sodium: 135 mmol/L (ref 135–145)
TOTAL PROTEIN: 5.9 g/dL — AB (ref 6.5–8.1)

## 2015-08-27 LAB — GLUCOSE, CAPILLARY: Glucose-Capillary: 96 mg/dL (ref 65–99)

## 2015-08-27 NOTE — Discharge Instructions (Signed)
Fetal Movement Counts  Patient Name: __________________________________________________ Patient Due Date: ____________________  Performing a fetal movement count is highly recommended in high-risk pregnancies, but it is good for every pregnant woman to do. Your health care provider may ask you to start counting fetal movements at 28 weeks of the pregnancy. Fetal movements often increase:  · After eating a full meal.  · After physical activity.  · After eating or drinking something sweet or cold.  · At rest.  Pay attention to when you feel the baby is most active. This will help you notice a pattern of your baby's sleep and wake cycles and what factors contribute to an increase in fetal movement. It is important to perform a fetal movement count at the same time each day when your baby is normally most active.   HOW TO COUNT FETAL MOVEMENTS  1. Find a quiet and comfortable area to sit or lie down on your left side. Lying on your left side provides the best blood and oxygen circulation to your baby.  2. Write down the day and time on a sheet of paper or in a journal.  3. Start counting kicks, flutters, swishes, rolls, or jabs in a 2-hour period. You should feel at least 10 movements within 2 hours.  4. If you do not feel 10 movements in 2 hours, wait 2-3 hours and count again. Look for a change in the pattern or not enough counts in 2 hours.  SEEK MEDICAL CARE IF:  · You feel less than 10 counts in 2 hours, tried twice.  · There is no movement in over an hour.  · The pattern is changing or taking longer each day to reach 10 counts in 2 hours.  · You feel the baby is not moving as he or she usually does.  Date: ____________ Movements: ____________ Start time: ____________ Finish time: ____________   Date: ____________ Movements: ____________ Start time: ____________ Finish time: ____________  Date: ____________ Movements: ____________ Start time: ____________ Finish time: ____________  Date: ____________ Movements:  ____________ Start time: ____________ Finish time: ____________  Date: ____________ Movements: ____________ Start time: ____________ Finish time: ____________  Date: ____________ Movements: ____________ Start time: ____________ Finish time: ____________  Date: ____________ Movements: ____________ Start time: ____________ Finish time: ____________  Date: ____________ Movements: ____________ Start time: ____________ Finish time: ____________   Date: ____________ Movements: ____________ Start time: ____________ Finish time: ____________  Date: ____________ Movements: ____________ Start time: ____________ Finish time: ____________  Date: ____________ Movements: ____________ Start time: ____________ Finish time: ____________  Date: ____________ Movements: ____________ Start time: ____________ Finish time: ____________  Date: ____________ Movements: ____________ Start time: ____________ Finish time: ____________  Date: ____________ Movements: ____________ Start time: ____________ Finish time: ____________  Date: ____________ Movements: ____________ Start time: ____________ Finish time: ____________   Date: ____________ Movements: ____________ Start time: ____________ Finish time: ____________  Date: ____________ Movements: ____________ Start time: ____________ Finish time: ____________  Date: ____________ Movements: ____________ Start time: ____________ Finish time: ____________  Date: ____________ Movements: ____________ Start time: ____________ Finish time: ____________  Date: ____________ Movements: ____________ Start time: ____________ Finish time: ____________  Date: ____________ Movements: ____________ Start time: ____________ Finish time: ____________  Date: ____________ Movements: ____________ Start time: ____________ Finish time: ____________   Date: ____________ Movements: ____________ Start time: ____________ Finish time: ____________  Date: ____________ Movements: ____________ Start time: ____________ Finish  time: ____________  Date: ____________ Movements: ____________ Start time: ____________ Finish time: ____________  Date: ____________ Movements: ____________ Start time:   ____________ Finish time: ____________  Date: ____________ Movements: ____________ Start time: ____________ Finish time: ____________  Date: ____________ Movements: ____________ Start time: ____________ Finish time: ____________  Date: ____________ Movements: ____________ Start time: ____________ Finish time: ____________   Date: ____________ Movements: ____________ Start time: ____________ Finish time: ____________  Date: ____________ Movements: ____________ Start time: ____________ Finish time: ____________  Date: ____________ Movements: ____________ Start time: ____________ Finish time: ____________  Date: ____________ Movements: ____________ Start time: ____________ Finish time: ____________  Date: ____________ Movements: ____________ Start time: ____________ Finish time: ____________  Date: ____________ Movements: ____________ Start time: ____________ Finish time: ____________  Date: ____________ Movements: ____________ Start time: ____________ Finish time: ____________   Date: ____________ Movements: ____________ Start time: ____________ Finish time: ____________  Date: ____________ Movements: ____________ Start time: ____________ Finish time: ____________  Date: ____________ Movements: ____________ Start time: ____________ Finish time: ____________  Date: ____________ Movements: ____________ Start time: ____________ Finish time: ____________  Date: ____________ Movements: ____________ Start time: ____________ Finish time: ____________  Date: ____________ Movements: ____________ Start time: ____________ Finish time: ____________  Date: ____________ Movements: ____________ Start time: ____________ Finish time: ____________   Date: ____________ Movements: ____________ Start time: ____________ Finish time: ____________  Date: ____________  Movements: ____________ Start time: ____________ Finish time: ____________  Date: ____________ Movements: ____________ Start time: ____________ Finish time: ____________  Date: ____________ Movements: ____________ Start time: ____________ Finish time: ____________  Date: ____________ Movements: ____________ Start time: ____________ Finish time: ____________  Date: ____________ Movements: ____________ Start time: ____________ Finish time: ____________  Date: ____________ Movements: ____________ Start time: ____________ Finish time: ____________   Date: ____________ Movements: ____________ Start time: ____________ Finish time: ____________  Date: ____________ Movements: ____________ Start time: ____________ Finish time: ____________  Date: ____________ Movements: ____________ Start time: ____________ Finish time: ____________  Date: ____________ Movements: ____________ Start time: ____________ Finish time: ____________  Date: ____________ Movements: ____________ Start time: ____________ Finish time: ____________  Date: ____________ Movements: ____________ Start time: ____________ Finish time: ____________     This information is not intended to replace advice given to you by your health care provider. Make sure you discuss any questions you have with your health care provider.     Document Released: 09/21/2006 Document Revised: 09/12/2014 Document Reviewed: 06/18/2012  Elsevier Interactive Patient Education ©2016 [employer]

## 2015-08-27 NOTE — MAU Provider Note (Signed)
History     CSN: [id number]  Arrival date and time: 08/27/15 1153   First Provider Initiated Contact with Patient 08/27/15 1302      No chief complaint on file.  HPI   Ms.Melinda Powell is a 19 y.o. female G3P0020 at 23w6dwith a history of gestational diabetes; on glyburide presenting from the office for further fetal monitoring and BPP. The patient had a non reactive NST in the office today.   + fetal movement Denies leaking or vaginal bleeding.   OB History    Gravida Para Term Preterm AB TAB SAB Ectopic Multiple Living   _0 0      Past Medical History  Diagnosis Date  . Miscarriage   . Anemia   . Gestational diabetes   . Hypertension     Past Surgical History  Procedure Laterality Date  . No past surgeries      Family History  Problem Relation Age of Onset  . Diabetes Mother   . Diabetes Father     Social History  Substance Use Topics  . Smoking status: Former Smoker    Types: Cigarettes    Quit date: 01/23/2015  . Smokeless tobacco: Never Used  . Alcohol Use: No    Allergies: No Known Allergies  Prescriptions prior to admission  Medication Sig Dispense Refill Last Dose  . glyBURIDE (DIABETA) 2.5 MG tablet Take 2.5 mg by mouth daily with breakfast.   08/27/2015 at Unknown time  . IRON PO Take 1 tablet by mouth daily.   08/27/2015 at Unknown time  . Prenatal Vit-Fe Fumarate-FA (PRENATAL MULTIVITAMIN) TABS tablet Take 1 tablet by mouth daily at 12 noon.   08/27/2015 at Unknown time   Results for orders placed or performed during the hospital encounter of 08/27/15 (from the past 48 hour(s))  Urinalysis, [facility] w reflex microscopic [facility]     Status: Abnormal   Collection Time: 08/27/15 12:02 PM  Result Value Ref Range   Color, Urine YELLOW YELLOW   APPearance CLEAR CLEAR   Specific Gravity, Urine >1.030 (H) 1.005 - 1.030   pH 6.0 5.0 - 8.0   Glucose, UA 500 (A) NEGATIVE mg/dL   Hgb urine dipstick NEGATIVE NEGATIVE   Bilirubin Urine NEGATIVE NEGATIVE   Ketones, ur NEGATIVE NEGATIVE mg/dL   Protein, ur NEGATIVE NEGATIVE mg/dL   Nitrite NEGATIVE NEGATIVE   Leukocytes, UA NEGATIVE NEGATIVE    Comment: MICROSCOPIC NOT DONE ON URINES WITH NEGATIVE PROTEIN, BLOOD, LEUKOCYTES, NITRITE, OR GLUCOSE <1000 mg/dL.  CBC     Status: Abnormal   Collection Time: 08/27/15  1:46 PM  Result Value Ref Range   WBC 7.7 4.0 - 10.5 K/uL   RBC 4.41 3.87 - 5.11 MIL/uL   Hemoglobin 11.8 (L) 12.0 - 15.0 g/dL   HCT 35.9 (L) 36.0 - 46.0 %   MCV 81.4 78.0 - 100.0 fL   MCH 26.8 26.0 - 34.0 pg   MCHC 32.9 30.0 - 36.0 g/dL   RDW 15.4 11.5 - 15.5 %   Platelets 236 150 - 400 K/uL  Comprehensive metabolic panel     Status: Abnormal   Collection Time: 08/27/15  1:46 PM  Result Value Ref Range   Sodium 135 135 - 145 mmol/L   Potassium 4.2 3.5 - 5.1 mmol/L   Chloride 105 101 - 111 mmol/L   CO2 23 22 - 32 mmol/L   Glucose, Bld 94 65 - 99 mg/dL   BUN  9 6 - 20 mg/dL   Creatinine, Ser 0.43 (L) 0.44 - 1.00 mg/dL   Calcium 9.4 8.9 - 10.3 mg/dL   Total Protein 5.9 (L) 6.5 - 8.1 g/dL   Albumin 2.8 (L) 3.5 - 5.0 g/dL   AST 14 (L) 15 - 41 U/L   ALT 12 (L) 14 - 54 U/L   Alkaline Phosphatase 89 38 - 126 U/L   Total Bilirubin 0.5 0.3 - 1.2 mg/dL   GFR calc non Af Amer >60 >60 mL/min   GFR calc Af Amer >60 >60 mL/min    Comment: (NOTE) The eGFR has been calculated using the CKD EPI equation. This calculation has not been validated in all clinical situations. eGFR's persistently <60 mL/min signify possible Chronic Kidney Disease.    Anion gap 7 5 - 15  Glucose, capillary     Status: None   Collection Time: 08/27/15  2:25 PM  Result Value Ref Range   Glucose-Capillary 96 65 - 99 mg/dL    Review of Systems  Constitutional: Negative for fever and chills.  Eyes: Negative for blurred vision.  Gastrointestinal: Negative for nausea, vomiting and abdominal pain.  Neurological: Negative for headaches.   Physical Exam   Blood pressure  135/85, pulse 104, temperature 98.4 F (36.9 C), temperature source Oral, resp. rate 20, last menstrual period 12/12/2014, SpO2 98 %.  Physical Exam  Constitutional: She is oriented to person, place, and time. She appears well-developed and well-nourished. No distress.  HENT:  Head: Normocephalic.  Eyes: Pupils are equal, round, and reactive to light.  Neck: Neck supple.  Cardiovascular: Normal rate.   Respiratory: Effort normal. No respiratory distress. She has no wheezes.  GI: Soft. She exhibits no distension. There is no tenderness. There is no rebound.  Musculoskeletal: Normal range of motion.  Neurological: She is alert and oriented to person, place, and time. She has normal reflexes. She displays normal reflexes.  Negative clonus   Skin: Skin is warm. She is not diaphoretic.  Psychiatric: Her behavior is normal.   Fetal Tracing: Baseline: 145 bpm  Variability: Moderate  Accelerations: 15x15 Decelerations: none Toco:quiet   MAU Course  Procedures  None  MDM  BPP, CMP, CBC per Dr. Rogue Bussing  Discussed patient with Dr. Rogue Bussing @ 1515; Discussed BPP, Labs, HPI, and physical exam.   Assessment and Plan   A:  1. NST (non-stress test) reactive   2. Decreased fetal movement   3. Chronic hypertension in pregnancy, third trimester    P:  Discharge home in stable condition Preeclampsia precautions Follow up with OB as scheduled Kick counts  Return to MAU if symptoms worsen  Lezlie Lye, NP 08/27/2015 3:57 PM

## 2015-08-27 NOTE — MAU Note (Addendum)
Pt sent from office for nonreactive stress test.  Hx of chronic HTN and gestational diabetes.  Here for further monitoring and testing.  Pt states baby is moving just not as much.  Denies vaginal bleeding, abnormal discharge, or ROM.

## 2015-09-01 ENCOUNTER — Inpatient Hospital Stay (HOSPITAL_COMMUNITY)
Admission: AD | Admit: 2015-09-01 | Discharge: 2015-09-01 | Disposition: A | Discharge status: Home / Self Care | Payer: Managed Care, Other (non HMO) | Source: Ambulatory Visit | Attending: Obstetrics and Gynecology | Admitting: Obstetrics and Gynecology

## 2015-09-01 ENCOUNTER — Encounter (HOSPITAL_COMMUNITY): Payer: Self-pay

## 2015-09-01 DIAGNOSIS — Z3A37 37 weeks gestation of pregnancy: Secondary | ICD-10-CM | POA: Diagnosis not present

## 2015-09-01 DIAGNOSIS — O133 Gestational [pregnancy-induced] hypertension without significant proteinuria, third trimester: Secondary | ICD-10-CM

## 2015-09-01 DIAGNOSIS — O10913 Unspecified pre-existing hypertension complicating pregnancy, third trimester: Secondary | ICD-10-CM | POA: Diagnosis not present

## 2015-09-01 DIAGNOSIS — R030 Elevated blood-pressure reading, without diagnosis of hypertension: Secondary | ICD-10-CM | POA: Diagnosis present

## 2015-09-01 DIAGNOSIS — Z87891 Personal history of nicotine dependence: Secondary | ICD-10-CM | POA: Insufficient documentation

## 2015-09-01 LAB — URINALYSIS, ROUTINE W REFLEX MICROSCOPIC
BILIRUBIN URINE: NEGATIVE
Glucose, UA: NEGATIVE mg/dL
Hgb urine dipstick: NEGATIVE
Ketones, ur: 15 mg/dL — AB
Nitrite: NEGATIVE
Protein, ur: NEGATIVE mg/dL
Specific Gravity, Urine: >1.03 — ABNORMAL HIGH (ref 1.005–1.030)
pH: 6 (ref 5.0–8.0)

## 2015-09-01 LAB — COMPREHENSIVE METABOLIC PANEL
ALT: 11 U/L — ABNORMAL LOW (ref 14–54)
ANION GAP: 11 (ref 5–15)
AST: 14 U/L — ABNORMAL LOW (ref 15–41)
Albumin: 3 g/dL — ABNORMAL LOW (ref 3.5–5.0)
Alkaline Phosphatase: 97 U/L (ref 38–126)
BUN: 8 mg/dL (ref 6–20)
CO2: 21 mmol/L — ABNORMAL LOW (ref 22–32)
CREATININE: 0.52 mg/dL (ref 0.44–1.00)
Calcium: 9.3 mg/dL (ref 8.9–10.3)
Chloride: 104 mmol/L (ref 101–111)
GFR calc Af Amer: >60 mL/min (ref >60)
Glucose, Bld: 100 mg/dL — ABNORMAL HIGH (ref 65–99)
POTASSIUM: 3.5 mmol/L (ref 3.5–5.1)
Sodium: 136 mmol/L (ref 135–145)
Total Bilirubin: 0.3 mg/dL (ref 0.3–1.2)
Total Protein: 6.7 g/dL (ref 6.5–8.1)

## 2015-09-01 LAB — CBC WITH DIFFERENTIAL/PLATELET
Basophils Absolute: 0 10*3/uL (ref 0.0–0.1)
Basophils Relative: 0 %
EOS ABS: 0 10*3/uL (ref 0.0–0.7)
EOS PCT: 1 %
HCT: 36.6 % (ref 36.0–46.0)
Hemoglobin: 12.1 g/dL (ref 12.0–15.0)
LYMPHS ABS: 1.9 10*3/uL (ref 0.7–4.0)
Lymphocytes Relative: 22 %
MCH: 26.8 pg (ref 26.0–34.0)
MCHC: 33.1 g/dL (ref 30.0–36.0)
MCV: 81 fL (ref 78.0–100.0)
MONO ABS: 0.3 10*3/uL (ref 0.1–1.0)
MONOS PCT: 3 %
NEUTROS ABS: 6.5 10*3/uL (ref 1.7–7.7)
NEUTROS PCT: 74 %
PLATELETS: 221 10*3/uL (ref 150–400)
RBC: 4.52 MIL/uL (ref 3.87–5.11)
RDW: 15.5 % (ref 11.5–15.5)
WBC: 8.7 10*3/uL (ref 4.0–10.5)

## 2015-09-01 LAB — PROTEIN / CREATININE RATIO, URINE
Creatinine, Urine: 266 mg/dL
Protein Creatinine Ratio: 0.1 mg/mg{Cre} (ref 0.00–0.15)
Total Protein, Urine: 26 mg/dL

## 2015-09-01 LAB — URINE MICROSCOPIC-ADD ON

## 2015-09-01 NOTE — MAU Note (Signed)
Pt presents from the office for evaluation of increased BP. Deneis HA or visual changes changes. Denies pain. Reports good fetal movement.

## 2015-09-01 NOTE — MAU Provider Note (Signed)
History   G3P0020 @ 37.4 wks sent from office by Dr. Henderson CloudHorvath for eval for elevated BP's. Denies headache, blurred vision or epigastric pain.  CSN: [id number]  Arrival date & time 09/01/15  1442   None     Chief Complaint  Patient presents with  . Blood Pressure Eval     HPI  Past Medical History  Diagnosis Date  . Miscarriage   . Anemia   . Gestational diabetes   . Hypertension     Past Surgical History  Procedure Laterality Date  . No past surgeries      Family History  Problem Relation Age of Onset  . Diabetes Mother   . Diabetes Father     Social History  Substance Use Topics  . Smoking status: Former Smoker    Types: Cigarettes    Quit date: 01/23/2015  . Smokeless tobacco: Never Used  . Alcohol Use: No    OB History    Gravida Para Term Preterm AB TAB SAB Ectopic Multiple Living   3    2 1 1    0      Review of Systems  Constitutional: Negative.   HENT: Negative.   Eyes: Negative.   Respiratory: Negative.   Cardiovascular: Negative.   Gastrointestinal: Negative.   Endocrine: Negative.   Genitourinary: Negative.   Musculoskeletal: Negative.   Skin: Negative.   Allergic/Immunologic: Negative.   Neurological: Negative.   Hematological: Negative.   Psychiatric/Behavioral: Negative.     Allergies  Review of patient's allergies indicates no known allergies.  Home Medications  No current outpatient prescriptions on file.  BP 155/91 mmHg  Pulse 117  Temp(Src) 98.1 F (36.7 C) (Oral)  Resp 18  LMP 12/12/2014 (Exact Date)  Physical Exam  Constitutional: She is oriented to person, place, and time. She appears well-developed and well-nourished.  HENT:  Head: Normocephalic.  Eyes: Pupils are equal, round, and reactive to light.  Neck: Normal range of motion.  Cardiovascular: Normal rate, regular rhythm, normal heart sounds and intact distal pulses.   Pulmonary/Chest: Effort normal and breath sounds normal.  Abdominal: Soft. Bowel sounds  are normal.  Genitourinary: Vagina normal and uterus normal.  Musculoskeletal: Normal range of motion.  Neurological: She is alert and oriented to person, place, and time. She has normal reflexes.  Skin: Skin is warm and dry.  Psychiatric: She has a normal mood and affect. Her behavior is normal. Judgment and thought content normal.    MAU Course  Procedures (including critical care time)  Labs Reviewed  CBC WITH DIFFERENTIAL/PLATELET  COMPREHENSIVE METABOLIC PANEL  URINALYSIS, ROUTINE W REFLEX MICROSCOPIC (NOT AT [facility])  PROTEIN / CREATININE RATIO, URINE   No results found.   No diagnosis found.    MDM  preg at 37.4 hypertension in preg. CBC, CMP, P-R ratio and normal. Spoke with Dr. Henderson CloudHorvath and given report on pt. Pt to follow up in two days with her in office.

## 2015-09-01 NOTE — Discharge Instructions (Signed)
Hypertension During Pregnancy °Hypertension is also called high blood pressure. Blood pressure moves blood in your body. Sometimes, the force that moves the blood becomes too strong. When you are pregnant, this condition should be watched carefully. It can cause problems for you and your baby. °HOME CARE  °· Make and keep all of your doctor visits. °· Take medicine as told by your doctor. Tell your doctor about all medicines you take. °· Eat very little salt. °· Exercise regularly. °· Do not drink alcohol. °· Do not smoke. °· Do not have drinks with caffeine. °· Lie on your left side when resting. °· Your health care provider may ask you to take one low-dose aspirin (81mg) each day. °GET HELP RIGHT AWAY IF: °· You have bad belly (abdominal) pain. °· You have sudden puffiness (swelling) in the hands, ankles, or face. °· You gain 4 pounds (1.8 kilograms) or more in 1 week. °· You throw up (vomit) repeatedly. °· You have bleeding from the vagina. °· You do not feel the baby moving as much. °· You have a headache. °· You have blurred or double vision. °· You have muscle twitching or spasms. °· You have shortness of breath. °· You have blue fingernails and lips. °· You have blood in your pee (urine). °MAKE SURE YOU: °· Understand these instructions. °· Will watch your condition. °· Will get help right away if you are not doing well or get worse. °  °This information is not intended to replace advice given to you by your health care provider. Make sure you discuss any questions you have with your health care provider. °  °Document Released: 09/24/2010 Document Revised: 09/12/2014 Document Reviewed: 03/21/2013 °Elsevier Interactive Patient Education ©2016 [employer] ° °

## 2015-09-03 ENCOUNTER — Encounter (HOSPITAL_COMMUNITY): Payer: Self-pay | Admitting: *Deleted

## 2015-09-03 ENCOUNTER — Inpatient Hospital Stay (HOSPITAL_COMMUNITY)
Admission: AD | Admit: 2015-09-03 | Discharge: 2015-09-03 | Disposition: A | Discharge status: Home / Self Care | Payer: Managed Care, Other (non HMO) | Source: Ambulatory Visit | Attending: Obstetrics and Gynecology | Admitting: Obstetrics and Gynecology

## 2015-09-03 DIAGNOSIS — O133 Gestational [pregnancy-induced] hypertension without significant proteinuria, third trimester: Secondary | ICD-10-CM | POA: Diagnosis not present

## 2015-09-03 DIAGNOSIS — Z87891 Personal history of nicotine dependence: Secondary | ICD-10-CM | POA: Diagnosis not present

## 2015-09-03 DIAGNOSIS — O26893 Other specified pregnancy related conditions, third trimester: Secondary | ICD-10-CM | POA: Diagnosis not present

## 2015-09-03 DIAGNOSIS — O10013 Pre-existing essential hypertension complicating pregnancy, third trimester: Secondary | ICD-10-CM | POA: Insufficient documentation

## 2015-09-03 DIAGNOSIS — Z3A36 36 weeks gestation of pregnancy: Secondary | ICD-10-CM | POA: Diagnosis not present

## 2015-09-03 DIAGNOSIS — R0989 Other specified symptoms and signs involving the circulatory and respiratory systems: Secondary | ICD-10-CM

## 2015-09-03 DIAGNOSIS — O288 Other abnormal findings on antenatal screening of mother: Secondary | ICD-10-CM

## 2015-09-03 LAB — PROTEIN / CREATININE RATIO, URINE
CREATININE, URINE: 83 mg/dL
PROTEIN CREATININE RATIO: 0.13 mg/mg{creat} (ref 0.00–0.15)
TOTAL PROTEIN, URINE: 11 mg/dL

## 2015-09-03 NOTE — Discharge Instructions (Signed)

## 2015-09-03 NOTE — MAU Note (Signed)
Pt sent from MD office, non reactive NST, also has hypertension.  Denies uc's, bleeding or LOF.

## 2015-09-03 NOTE — MAU Provider Note (Signed)
Chief Complaint: nonreactive nst    First Provider Initiated Contact with Patient 09/03/15 1350        SUBJECTIVE HPI HPI: Melinda Powell is a 19 y.o. G3P0020 at 29w5dby LMP who presents to maternity admissions reporting nonreactive NST in office. Had same yesterday but had a good BPP 8/8.  Also has some hypertension. She denies vaginal bleeding, vaginal itching/burning, urinary symptoms, h/a, dizziness, n/v, or fever/chills.    RN Note: Pt sent from MD office, non reactive NST, also has hypertension. Denies uc's, bleeding or LOF.        Past Medical History  Diagnosis Date  . Miscarriage   . Anemia   . Gestational diabetes   . Hypertension    Past Surgical History  Procedure Laterality Date  . No past surgeries     Social History   Social History  . Marital Status: Single    Spouse Name: N/A  . Number of Children: N/A  . Years of Education: N/A   Occupational History  . Not on file.   Social History Main Topics  . Smoking status: Former Smoker    Types: Cigarettes    Quit date: 01/23/2015  . Smokeless tobacco: Never Used  . Alcohol Use: No  . Drug Use: No  . Sexual Activity: Yes    Birth Control/ Protection: None   Other Topics Concern  . Not on file   Social History Narrative   No current facility-administered medications on file prior to encounter.   Current Outpatient Prescriptions on File Prior to Encounter  Medication Sig Dispense Refill  . glyBURIDE (DIABETA) 2.5 MG tablet Take 2.5 mg by mouth daily with breakfast.    . IRON PO Take 1 tablet by mouth daily.    . Prenatal Vit-Fe Fumarate-FA (PRENATAL MULTIVITAMIN) TABS tablet Take 1 tablet by mouth daily at 12 noon.     No Known Allergies  ROS:  Review of Systems Review of Systems  Constitutional: Negative for fever and chills.  Gastrointestinal: Negative for nausea, vomiting, abdominal pain, diarrhea and constipation.  Genitourinary: Negative for dysuria.  Musculoskeletal: Negative  for back pain.  Neurological: Negative for dizziness and weakness.    I have reviewed patient's Past Medical Hx, Surgical Hx, Family Hx, Social Hx, medications and allergies.   Physical Exam  Patient Vitals for the past 24 hrs:  BP Temp Temp src Pulse Resp  09/03/15 1406 130/80 mmHg - - - -  09/03/15 1401 143/98 mmHg - - 103 -  09/03/15 1346 146/100 mmHg - - 94 -  09/03/15 1331 140/85 mmHg - - 111 -  09/03/15 1327 147/84 mmHg - - 109 -  09/03/15 1326 147/84 mmHg - - 106 -  09/03/15 1302 150/87 mmHg 98 F (36.7 C) Oral 101 20   The two readings of 143/98 and 146/100 were taken while pt was lying on her arm that the cuff was on.  Physical Exam  Constitutional: Well-developed, well-nourished female in no acute distress.  Cardiovascular: normal rate Respiratory: normal effort GI: Abd soft, non-tender. Pos BS x 4 MS: Extremities nontender, no edema, normal ROM Neurologic: Alert and oriented x 4.  GU: Neg CVAT.  NST:  Reactive now, once I turned her to left side. Fetus very active. Good accelerations.            Irregular contractions.  LAB RESULTS Results for orders placed or performed during the hospital encounter of 09/03/15 (from the past 24 hour(s))  Protein / creatinine ratio, urine  Status: None   Collection Time: 09/03/15  1:10 PM  Result Value Ref Range   Creatinine, Urine 83.00 mg/dL   Total Protein, Urine 11 mg/dL   Protein Creatinine Ratio 0.13 0.00 - 0.15 mg/mg[Cre]    Ref. Range 09/01/2015 15:26  Sodium Latest Ref Range: 135-145 mmol/L 136  Potassium Latest Ref Range: 3.5-5.1 mmol/L 3.5  Chloride Latest Ref Range: 101-111 mmol/L 104  CO2 Latest Ref Range: 22-32 mmol/L 21 (L)  BUN Latest Ref Range: 6-20 mg/dL 8  Creatinine Latest Ref Range: 0.44-1.00 mg/dL 0.52  Calcium Latest Ref Range: 8.9-10.3 mg/dL 9.3  EGFR (Non-African Amer.) Latest Ref Range: >60 mL/min >60  EGFR (African American) Latest Ref Range: >60 mL/min >60  Glucose Latest Ref Range: 65-99  mg/dL 100 (H)  Anion gap Latest Ref Range: 5-15  11  Alkaline Phosphatase Latest Ref Range: 38-126 U/L 97  Albumin Latest Ref Range: 3.5-5.0 g/dL 3.0 (L)  AST Latest Ref Range: 15-41 U/L 14 (L)  ALT Latest Ref Range: 14-54 U/L 11 (L)  Total Protein Latest Ref Range: 6.5-8.1 g/dL 6.7  Total Bilirubin Latest Ref Range: 0.3-1.2 mg/dL 0.3     Ref. Range 09/01/2015 15:26  WBC Latest Ref Range: 4.0-10.5 K/uL 8.7  RBC Latest Ref Range: 3.87-5.11 MIL/uL 4.52  Hemoglobin Latest Ref Range: 12.0-15.0 g/dL 12.1  HCT Latest Ref Range: 36.0-46.0 % 36.6  MCV Latest Ref Range: 78.0-100.0 fL 81.0  MCH Latest Ref Range: 26.0-34.0 pg 26.8  MCHC Latest Ref Range: 30.0-36.0 g/dL 33.1  RDW Latest Ref Range: 11.5-15.5 % 15.5  Platelets Latest Ref Range: 150-400 K/uL 221   --/--/A POS (05/26 1543)  IMAGING [place] Mfm Fetal Bpp Wo Non Stress  08/27/2015  OBSTETRICAL ULTRASOUND: This exam was performed within a [facility]. The OB US report was generated in the AS system, and faxed to the ordering physician.  This report is available in the [facility]. See the AS Obstetric US report via the Image Link.  [place] Mfm Fetal Bpp Wo Non Stress  08/18/2015  OBSTETRICAL ULTRASOUND: This exam was performed within a  Ultrasound Department. The OB US report was generated in the AS system, and faxed to the ordering physician.  This report is available in the [facility]. See the AS Obstetric US report via the Image Link.   MAU Management/MDM: Ordered labs and reviewed results.  Consult Dr Ouida Sills. He instructs her to keep appt tomorrow for [place] and they will discuss IOL then.  Pt stable at time of discharge.  ASSESSMENT SIUP at [redacted]w[redacted]d Nonreactive NST, now reactive Labile hypertension in pregnancy  PLAN Discharge home Preeclampsia precautions Labor precautions Fetal movement monitoring Keep appointment tomorrow    Medication List    ASK your doctor about these medications         glyBURIDE 2.5 MG tablet  Commonly known as:  DIABETA  Take 2.5 mg by mouth daily with breakfast.     IRON PO  Take 1 tablet by mouth daily.     prenatal multivitamin Tabs tablet  Take 1 tablet by mouth daily at 12 noon.         MHansel FeinsteinCNM, MSN Certified Nurse-Midwife 09/03/2015  2:46 PM

## 2015-09-04 ENCOUNTER — Other Ambulatory Visit: Payer: Self-pay | Admitting: Obstetrics

## 2015-09-04 ENCOUNTER — Encounter (HOSPITAL_COMMUNITY): Payer: Self-pay | Admitting: *Deleted

## 2015-09-04 ENCOUNTER — Telehealth (HOSPITAL_COMMUNITY): Payer: Self-pay | Admitting: *Deleted

## 2015-09-04 NOTE — Telephone Encounter (Signed)
Preadmission screen  

## 2015-09-06 NOTE — L&D Delivery Note (Signed)
Called by RN alerting me that baby was having persistent decel x 10min.  Advised nurse I would proceed immediately to hospital and to call faculty MD immediately to proceed with STAT c/s. I arrived approx 10 min later with Dr. Debroah LoopArnold in attendance.  FSE had been placed and pt was found to be complete and wanting to push.   FHT showed good variability with indeterminent baseline and early variables with good recovery. Patient was C/C/+2 and pushed for approx 10 min after the time of my arrival with epidural.  Vacuum assisted d/t development of anotherr potential prolonged decel.  Vacuum placed and pt pushed once, with one pull and no pop off. NSVD female infant, Apgars 8/9, weight 6/12.   The patient had a 2nd degree laceration repaired with 2-0 vicryl and a Right labial tear repaired with 3-0 vicryl. Fundus was firm but enlarged, manual evacuation of clot and membranous material performed and 1000mcg of cytotec placed for prophylaxis EBL was approx 500cc Placenta was examined and appeared intact. Vagina was clear.  Baby was vigorous and doing skin to skin with mother.  Melinda AspenALLAHAN, Melinda Powell

## 2015-09-07 ENCOUNTER — Encounter (HOSPITAL_COMMUNITY): Payer: Managed Care, Other (non HMO)

## 2015-09-07 ENCOUNTER — Inpatient Hospital Stay (HOSPITAL_COMMUNITY)
Admission: RE | Admit: 2015-09-07 | Discharge: 2015-09-10 | DRG: 775 | Disposition: A | Discharge status: Home / Self Care | Payer: Managed Care, Other (non HMO) | Source: Ambulatory Visit | Attending: Obstetrics | Admitting: Obstetrics

## 2015-09-07 DIAGNOSIS — Z87891 Personal history of nicotine dependence: Secondary | ICD-10-CM | POA: Diagnosis not present

## 2015-09-07 DIAGNOSIS — O24415 Gestational diabetes mellitus in pregnancy, controlled by oral hypoglycemic drugs: Secondary | ICD-10-CM | POA: Diagnosis present

## 2015-09-07 DIAGNOSIS — O99214 Obesity complicating childbirth: Secondary | ICD-10-CM | POA: Diagnosis present

## 2015-09-07 DIAGNOSIS — O134 Gestational [pregnancy-induced] hypertension without significant proteinuria, complicating childbirth: Principal | ICD-10-CM | POA: Diagnosis present

## 2015-09-07 DIAGNOSIS — Z3A37 37 weeks gestation of pregnancy: Secondary | ICD-10-CM

## 2015-09-07 DIAGNOSIS — O99824 Streptococcus B carrier state complicating childbirth: Secondary | ICD-10-CM | POA: Diagnosis present

## 2015-09-07 DIAGNOSIS — Z23 Encounter for immunization: Secondary | ICD-10-CM

## 2015-09-07 DIAGNOSIS — E669 Obesity, unspecified: Secondary | ICD-10-CM | POA: Diagnosis present

## 2015-09-07 DIAGNOSIS — Z6841 Body Mass Index (BMI) 40.0 and over, adult: Secondary | ICD-10-CM

## 2015-09-07 DIAGNOSIS — Z3483 Encounter for supervision of other normal pregnancy, third trimester: Secondary | ICD-10-CM | POA: Diagnosis present

## 2015-09-07 DIAGNOSIS — O24420 Gestational diabetes mellitus in childbirth, diet controlled: Secondary | ICD-10-CM | POA: Diagnosis present

## 2015-09-07 LAB — COMPREHENSIVE METABOLIC PANEL
ALBUMIN: 3.1 g/dL — AB (ref 3.5–5.0)
ALK PHOS: 109 U/L (ref 38–126)
ALT: 13 U/L — ABNORMAL LOW (ref 14–54)
AST: 18 U/L (ref 15–41)
Anion gap: 9 (ref 5–15)
BILIRUBIN TOTAL: 0.5 mg/dL (ref 0.3–1.2)
BUN: 9 mg/dL (ref 6–20)
CO2: 22 mmol/L (ref 22–32)
Calcium: 9.4 mg/dL (ref 8.9–10.3)
Chloride: 107 mmol/L (ref 101–111)
Creatinine, Ser: 0.55 mg/dL (ref 0.44–1.00)
GFR calc Af Amer: >60 mL/min (ref >60)
GFR calc non Af Amer: >60 mL/min (ref >60)
GLUCOSE: 78 mg/dL (ref 65–99)
POTASSIUM: 4 mmol/L (ref 3.5–5.1)
SODIUM: 138 mmol/L (ref 135–145)
TOTAL PROTEIN: 6.8 g/dL (ref 6.5–8.1)

## 2015-09-07 LAB — TYPE AND SCREEN
ABO/RH(D): A POS
ANTIBODY SCREEN: NEGATIVE

## 2015-09-07 LAB — GLUCOSE, CAPILLARY
GLUCOSE-CAPILLARY: 97 mg/dL (ref 65–99)
GLUCOSE-CAPILLARY: 90 mg/dL (ref 65–99)
Glucose-Capillary: 88 mg/dL (ref 65–99)
Glucose-Capillary: 84 mg/dL (ref 65–99)
Glucose-Capillary: 73 mg/dL (ref 65–99)

## 2015-09-07 LAB — CBC
HEMATOCRIT: 37.8 % (ref 36.0–46.0)
HEMOGLOBIN: 12.7 g/dL (ref 12.0–15.0)
MCH: 27.3 pg (ref 26.0–34.0)
MCHC: 33.6 g/dL (ref 30.0–36.0)
MCV: 81.3 fL (ref 78.0–100.0)
Platelets: 232 10*3/uL (ref 150–400)
RBC: 4.65 MIL/uL (ref 3.87–5.11)
RDW: 15.8 % — ABNORMAL HIGH (ref 11.5–15.5)
WBC: 7.1 10*3/uL (ref 4.0–10.5)

## 2015-09-07 LAB — ABO/RH: ABO/RH(D): A POS

## 2015-09-07 MED ORDER — LACTATED RINGERS IV SOLN
INTRAVENOUS | Status: DC
  Administered 2015-09-07: 125 mL/h via INTRAVENOUS
  Administered 2015-09-07 – 2015-09-09 (×4): via INTRAVENOUS

## 2015-09-07 MED ORDER — TERBUTALINE SULFATE 1 MG/ML IJ SOLN: 0.2500 mg | Freq: Once | INTRAMUSCULAR | Status: DC | PRN

## 2015-09-07 MED ORDER — BUTORPHANOL TARTRATE 1 MG/ML IJ SOLN
1.0000 mg | INTRAMUSCULAR | Status: DC | PRN
  Administered 2015-09-07 – 2015-09-08 (×10): 1 mg via INTRAVENOUS
  Filled 2015-09-07 (×10): qty 1

## 2015-09-07 MED ORDER — ACETAMINOPHEN 325 MG PO TABS: 650.0000 mg | ORAL_TABLET | ORAL | Status: DC | PRN

## 2015-09-07 MED ORDER — ONDANSETRON HCL 4 MG/2ML IJ SOLN
4.0000 mg | Freq: Four times a day (QID) | INTRAMUSCULAR | Status: DC | PRN
Start: 2015-09-07 — End: 2015-09-08

## 2015-09-07 MED ORDER — OXYTOCIN 40 UNITS IN LACTATED RINGERS INFUSION - SIMPLE MED: 62.5000 mL/h | INTRAVENOUS | Status: DC

## 2015-09-07 MED ORDER — CITRIC ACID-SODIUM CITRATE 334-500 MG/5ML PO SOLN
30.0000 mL | ORAL | Status: DC | PRN
  Filled 2015-09-07: qty 15

## 2015-09-07 MED ORDER — LABETALOL HCL 5 MG/ML IV SOLN
20.0000 mg | INTRAVENOUS | Status: DC | PRN
  Administered 2015-09-08: 40 mg via INTRAVENOUS
  Administered 2015-09-08: 20 mg via INTRAVENOUS
  Filled 2015-09-07: qty 8
  Filled 2015-09-07: qty 4

## 2015-09-07 MED ORDER — MISOPROSTOL 25 MCG QUARTER TABLET
25.0000 ug | ORAL_TABLET | ORAL | Status: DC | PRN
  Administered 2015-09-07 (×3): 25 ug via VAGINAL
  Filled 2015-09-07: qty 0.25
  Filled 2015-09-07: qty 1
  Filled 2015-09-07 (×3): qty 0.25

## 2015-09-07 MED ORDER — OXYCODONE-ACETAMINOPHEN 5-325 MG PO TABS: 2.0000 | ORAL_TABLET | ORAL | Status: DC | PRN

## 2015-09-07 MED ORDER — LACTATED RINGERS IV SOLN
500.0000 mL | INTRAVENOUS | Status: DC | PRN
  Administered 2015-09-08: 500 mL via INTRAVENOUS

## 2015-09-07 MED ORDER — HYDRALAZINE HCL 20 MG/ML IJ SOLN: 10.0000 mg | Freq: Once | INTRAMUSCULAR | Status: DC | PRN

## 2015-09-07 MED ORDER — OXYTOCIN 40 UNITS IN LACTATED RINGERS INFUSION - SIMPLE MED
1.0000 m[IU]/min | INTRAVENOUS | Status: DC
  Administered 2015-09-07: 2 m[IU]/min via INTRAVENOUS
  Filled 2015-09-07: qty 1000

## 2015-09-07 MED ORDER — PENICILLIN G POTASSIUM 5000000 UNITS IJ SOLR
5.0000 10*6.[IU] | Freq: Once | INTRAVENOUS | Status: AC
  Administered 2015-09-07: 5 10*6.[IU] via INTRAVENOUS
  Filled 2015-09-07: qty 5

## 2015-09-07 MED ORDER — OXYTOCIN BOLUS FROM INFUSION: 500.0000 mL | INTRAVENOUS | Status: DC

## 2015-09-07 MED ORDER — OXYCODONE-ACETAMINOPHEN 5-325 MG PO TABS: 1.0000 | ORAL_TABLET | ORAL | Status: DC | PRN

## 2015-09-07 MED ORDER — PENICILLIN G POTASSIUM 5000000 UNITS IJ SOLR
2.5000 10*6.[IU] | INTRAVENOUS | Status: DC
  Administered 2015-09-07 – 2015-09-08 (×10): 2.5 10*6.[IU] via INTRAVENOUS
  Filled 2015-09-07 (×13): qty 2.5

## 2015-09-07 MED ORDER — LIDOCAINE HCL (PF) 1 % IJ SOLN
30.0000 mL | INTRAMUSCULAR | Status: AC | PRN
Start: 2015-09-07 — End: 2015-09-10
  Administered 2015-09-08: 30 mL via SUBCUTANEOUS
  Filled 2015-09-07: qty 30

## 2015-09-07 NOTE — H&P (Signed)
Melinda BalloonChristina Powell is a 20 y.o. female presenting for elevated Blood pressures  20 yo G3P0020 admitted for induction of labor for gestational hypertension. Additionally, her pregnancy has been complicated by A2 DM and maternal obesity.  History OB History    Gravida Para Term Preterm AB TAB SAB Ectopic Multiple Living   3    2 1 1    0     Past Medical History  Diagnosis Date  . Miscarriage   . Anemia   . Hypertension   . Hx of varicella   . Gestational diabetes     glyburide   Past Surgical History  Procedure Laterality Date  . No past surgeries     Family History: family history includes Diabetes in her father and mother. Social History:  reports that she quit smoking about 7 months ago. Her smoking use included Cigarettes. She has never used smokeless tobacco. She reports that she does not drink alcohol or use illicit drugs.   Prenatal Transfer Tool  Maternal Diabetes: Yes:  Diabetes Type:  Insulin/Medication controlled Genetic Screening: Normal Maternal Ultrasounds/Referrals: Normal Fetal Ultrasounds or other Referrals:  None Maternal Substance Abuse:  No Significant Maternal Medications:  Meds include: Other: Glyburide Significant Maternal Lab Results:  None Other Comments:  None  ROS  Dilation: 2 Effacement (%): 50 Station: -3 Exam by:: Dr Tenny Crawoss Blood pressure 148/80, pulse 88, temperature 98.4 F (36.9 C), temperature source Oral, resp. rate 20, height 5\' 6"  (1.676 m), weight 124.286 kg (274 lb), last menstrual period 12/12/2014, SpO2 98 %. Exam Physical Exam  Prenatal labs: ABO, Rh: --/--/A POS, A POS (01/02 0125) Antibody: NEG (01/02 0125) Rubella: Immune (05/26 0000) RPR: Nonreactive (05/26 0000)  HBsAg: Negative (05/26 0000)  HIV: Non-reactive (05/26 0000)  GBS: Positive (12/19 0000)   Assessment/Plan: 1) Admit 2) Cytotec 25mcg PV Q 4 3) Labetalol per protocol 4) Epidural on request 5) AROM when able 6) GBS positive. Will start PCN when in more  active labor   Melinda Powell H. 09/07/2015, 4:03 PM

## 2015-09-07 NOTE — Progress Notes (Signed)
Tenny Crawoss, MD aware of current contraction pattern, start Pitocin per protocol.

## 2015-09-08 ENCOUNTER — Inpatient Hospital Stay (HOSPITAL_COMMUNITY): Payer: Managed Care, Other (non HMO) | Admitting: Anesthesiology

## 2015-09-08 ENCOUNTER — Encounter (HOSPITAL_COMMUNITY): Payer: Self-pay

## 2015-09-08 LAB — CBC
HCT: 37.5 % (ref 36.0–46.0)
Hemoglobin: 12.5 g/dL (ref 12.0–15.0)
MCH: 27.4 pg (ref 26.0–34.0)
MCHC: 33.3 g/dL (ref 30.0–36.0)
MCV: 82.2 fL (ref 78.0–100.0)
PLATELETS: 247 10*3/uL (ref 150–400)
RBC: 4.56 MIL/uL (ref 3.87–5.11)
RDW: 15.8 % — AB (ref 11.5–15.5)
WBC: 9.7 10*3/uL (ref 4.0–10.5)

## 2015-09-08 LAB — GLUCOSE, CAPILLARY
GLUCOSE-CAPILLARY: 107 mg/dL — AB (ref 65–99)
Glucose-Capillary: 94 mg/dL (ref 65–99)
Glucose-Capillary: 82 mg/dL (ref 65–99)
Glucose-Capillary: 98 mg/dL (ref 65–99)
Glucose-Capillary: 97 mg/dL (ref 65–99)

## 2015-09-08 LAB — PLATELET COUNT: PLATELETS: 225 10*3/uL (ref 150–400)

## 2015-09-08 LAB — RPR: RPR Ser Ql: NONREACTIVE

## 2015-09-08 MED ORDER — SODIUM BICARBONATE 8.4 % IV SOLN
INTRAVENOUS | Status: DC | PRN
  Administered 2015-09-08: 5 mL via EPIDURAL

## 2015-09-08 MED ORDER — SIMETHICONE 80 MG PO CHEW: 80.0000 mg | CHEWABLE_TABLET | ORAL | Status: DC | PRN

## 2015-09-08 MED ORDER — MAGNESIUM SULFATE BOLUS VIA INFUSION
4.0000 g | Freq: Once | INTRAVENOUS | Status: AC
Start: 2015-09-08 — End: 2015-09-08
  Administered 2015-09-08: 4 g via INTRAVENOUS
  Filled 2015-09-08: qty 500

## 2015-09-08 MED ORDER — ACETAMINOPHEN 325 MG PO TABS: 650.0000 mg | ORAL_TABLET | ORAL | Status: DC | PRN

## 2015-09-08 MED ORDER — MISOPROSTOL 200 MCG PO TABS
ORAL_TABLET | ORAL | Status: AC
  Filled 2015-09-08: qty 5

## 2015-09-08 MED ORDER — WITCH HAZEL-GLYCERIN EX PADS
1.0000 | MEDICATED_PAD | CUTANEOUS | Status: DC | PRN
Start: 2015-09-08 — End: 2015-09-10

## 2015-09-08 MED ORDER — MAGNESIUM SULFATE 50 % IJ SOLN
2.0000 g/h | INTRAVENOUS | Status: DC
  Filled 2015-09-08: qty 80

## 2015-09-08 MED ORDER — TETANUS-DIPHTH-ACELL PERTUSSIS 5-2.5-18.5 LF-MCG/0.5 IM SUSP: 0.5000 mL | Freq: Once | INTRAMUSCULAR | Status: DC

## 2015-09-08 MED ORDER — EPHEDRINE 5 MG/ML INJ
10.0000 mg | INTRAVENOUS | Status: DC | PRN
  Filled 2015-09-08: qty 2

## 2015-09-08 MED ORDER — LANOLIN HYDROUS EX OINT: TOPICAL_OINTMENT | CUTANEOUS | Status: DC | PRN

## 2015-09-08 MED ORDER — SENNOSIDES-DOCUSATE SODIUM 8.6-50 MG PO TABS
2.0000 | ORAL_TABLET | ORAL | Status: DC
  Administered 2015-09-08 – 2015-09-09 (×2): 2 via ORAL
  Filled 2015-09-08 (×2): qty 2

## 2015-09-08 MED ORDER — MISOPROSTOL 200 MCG PO TABS
1000.0000 ug | ORAL_TABLET | Freq: Once | ORAL | Status: AC
  Administered 2015-09-08: 1000 ug via RECTAL

## 2015-09-08 MED ORDER — ONDANSETRON HCL 4 MG PO TABS: 4.0000 mg | ORAL_TABLET | ORAL | Status: DC | PRN

## 2015-09-08 MED ORDER — DIBUCAINE 1 % RE OINT: 1.0000 "application " | TOPICAL_OINTMENT | RECTAL | Status: DC | PRN

## 2015-09-08 MED ORDER — DIPHENHYDRAMINE HCL 25 MG PO CAPS: 25.0000 mg | ORAL_CAPSULE | Freq: Four times a day (QID) | ORAL | Status: DC | PRN

## 2015-09-08 MED ORDER — ZOLPIDEM TARTRATE 5 MG PO TABS: 5.0000 mg | ORAL_TABLET | Freq: Every evening | ORAL | Status: DC | PRN

## 2015-09-08 MED ORDER — LIDOCAINE HCL (PF) 1 % IJ SOLN
INTRAMUSCULAR | Status: DC | PRN
  Administered 2015-09-08: 6 mL via EPIDURAL
  Administered 2015-09-08: 4 mL

## 2015-09-08 MED ORDER — ONDANSETRON HCL 4 MG/2ML IJ SOLN
4.0000 mg | INTRAMUSCULAR | Status: DC | PRN
Start: 2015-09-08 — End: 2015-09-10

## 2015-09-08 MED ORDER — DIPHENHYDRAMINE HCL 50 MG/ML IJ SOLN: 12.5000 mg | INTRAMUSCULAR | Status: DC | PRN

## 2015-09-08 MED ORDER — MAGNESIUM SULFATE 50 % IJ SOLN
2.0000 g/h | INTRAVENOUS | Status: DC
  Administered 2015-09-09: 2 g/h via INTRAVENOUS
  Filled 2015-09-08: qty 80

## 2015-09-08 MED ORDER — FENTANYL 2.5 MCG/ML BUPIVACAINE 1/10 % EPIDURAL INFUSION (WH - ANES)
14.0000 mL/h | INTRAMUSCULAR | Status: DC | PRN
  Administered 2015-09-08: 14 mL/h via EPIDURAL
  Filled 2015-09-08: qty 125

## 2015-09-08 MED ORDER — IBUPROFEN 600 MG PO TABS
600.0000 mg | ORAL_TABLET | Freq: Four times a day (QID) | ORAL | Status: DC
  Administered 2015-09-08 – 2015-09-10 (×8): 600 mg via ORAL
  Filled 2015-09-08 (×8): qty 1

## 2015-09-08 MED ORDER — OXYCODONE-ACETAMINOPHEN 5-325 MG PO TABS
2.0000 | ORAL_TABLET | ORAL | Status: DC | PRN
Start: 2015-09-08 — End: 2015-09-10

## 2015-09-08 MED ORDER — BENZOCAINE-MENTHOL 20-0.5 % EX AERO: 1.0000 "application " | INHALATION_SPRAY | CUTANEOUS | Status: DC | PRN

## 2015-09-08 MED ORDER — PRENATAL MULTIVITAMIN CH
1.0000 | ORAL_TABLET | Freq: Every day | ORAL | Status: DC
  Administered 2015-09-09 – 2015-09-10 (×2): 1 via ORAL
  Filled 2015-09-08 (×2): qty 1

## 2015-09-08 MED ORDER — PHENYLEPHRINE 40 MCG/ML (10ML) SYRINGE FOR IV PUSH (FOR BLOOD PRESSURE SUPPORT)
80.0000 ug | PREFILLED_SYRINGE | INTRAVENOUS | Status: DC | PRN
  Filled 2015-09-08: qty 20
  Filled 2015-09-08: qty 2

## 2015-09-08 MED ORDER — OXYCODONE-ACETAMINOPHEN 5-325 MG PO TABS: 1.0000 | ORAL_TABLET | ORAL | Status: DC | PRN

## 2015-09-08 NOTE — Anesthesia Preprocedure Evaluation (Signed)
Anesthesia Evaluation  Patient identified by MRN, date of birth, ID band Patient awake    Reviewed: Allergy & Precautions, H&P , Patient's Chart, lab work & pertinent test results  Airway Mallampati: IV  TM Distance: >3 FB Neck ROM: full    Dental  (+) Teeth Intact   Pulmonary former smoker,    breath sounds clear to auscultation       Cardiovascular hypertension,  Rhythm:regular Rate:Normal     Neuro/Psych    GI/Hepatic   Endo/Other  diabetes, GestationalMorbid obesity  Renal/GU      Musculoskeletal   Abdominal   Peds  Hematology   Anesthesia Other Findings       Reproductive/Obstetrics (+) Pregnancy                             Anesthesia Physical Anesthesia Plan  ASA: III  Anesthesia Plan: Epidural   Post-op Pain Management:    Induction:   Airway Management Planned:   Additional Equipment:   Intra-op Plan:   Post-operative Plan:   Informed Consent: I have reviewed the patients History and Physical, chart, labs and discussed the procedure including the risks, benefits and alternatives for the proposed anesthesia with the patient or authorized representative who has indicated his/her understanding and acceptance.   Dental Advisory Given  Plan Discussed with:   Anesthesia Plan Comments: (Labs checked- platelets confirmed with RN in room. Fetal heart tracing, per RN, reported to be stable enough for sitting procedure. Discussed epidural, and patient consents to the procedure:  included risk of possible headache,backache, failed block, allergic reaction, and nerve injury. This patient was asked if she had any questions or concerns before the procedure started.)        Anesthesia Quick Evaluation

## 2015-09-08 NOTE — Anesthesia Procedure Notes (Signed)
Epidural Patient location during procedure: OB  Preanesthetic Checklist Completed: patient identified, site marked, surgical consent, pre-op evaluation, timeout performed, IV checked, risks and benefits discussed and monitors and equipment checked  Epidural Patient position: sitting Prep: site prepped and draped and DuraPrep Patient monitoring: continuous pulse ox and blood pressure Approach: midline Location: L3-L4 Injection technique: LOR air  Needle:  Needle type: Tuohy  Needle gauge: 17 G Needle length: 9 cm and 9 Needle insertion depth: 9 cm Catheter type: closed end flexible Catheter size: 19 Gauge Catheter at skin depth: 17 cm Test dose: negative  Assessment Events: blood not aspirated, injection not painful, no injection resistance, negative IV test and no paresthesia  Additional Notes Dosing of Epidural:  1st dose, through catheter ............................................Marland Kitchen.  Xylocaine 40 mg  2nd dose, through catheter, after waiting 3 minutes........Marland Kitchen.Xylocaine 60 mg    As each dose occurred, patient was free of IV sx; and patient exhibited no evidence of SA injection.  Patient is more comfortable after epidural dosed. Please see RN's note for documentation of vital signs,and FHR which are stable.  Patient reminded not to try to ambulate with numb legs, and that an RN must be present when she attempts to get up.

## 2015-09-08 NOTE — Progress Notes (Signed)
Pt instructed she may begin pushng with ctxs

## 2015-09-08 NOTE — Progress Notes (Signed)
Late entry from am rounding Pt comfortable with intermittent Stadol. FHT Cat 2, no decels with good variability but limited accels d/t stadol TOCO q2-3 SVE 3/90/-4 A/P: cont. Current mgmt with pitocin Epidural when desired

## 2015-09-08 NOTE — Progress Notes (Addendum)
Dr. Claiborne Billingsallahan notified by J.Talmadge Coventryhornton, RN regarding prolonged FHR decel.  Dr. Claiborne Billingsallahan requested Faculty Practice be notified to start emergent Cesarean Section.

## 2015-09-08 NOTE — Progress Notes (Addendum)
Dr. Debroah LoopArnold @ bedside secondary FHR decel & pt primary MD en route.  Dr. Debroah LoopArnold informed pt having prolonged decel & primary MD called stat C-Section.  Dr. Debroah LoopArnold told pt C/C w/ urge to push.  Dr. Debroah LoopArnold instructed pt she may begin pushing with ctxs.

## 2015-09-09 ENCOUNTER — Encounter (HOSPITAL_COMMUNITY): Payer: Self-pay

## 2015-09-09 LAB — CBC
HCT: 34.2 % — ABNORMAL LOW (ref 36.0–46.0)
HEMOGLOBIN: 11.5 g/dL — AB (ref 12.0–15.0)
MCH: 27.4 pg (ref 26.0–34.0)
MCHC: 33.6 g/dL (ref 30.0–36.0)
MCV: 81.4 fL (ref 78.0–100.0)
Platelets: 241 10*3/uL (ref 150–400)
RBC: 4.2 MIL/uL (ref 3.87–5.11)
RDW: 15.7 % — ABNORMAL HIGH (ref 11.5–15.5)
WBC: 13.1 10*3/uL — ABNORMAL HIGH (ref 4.0–10.5)

## 2015-09-09 MED ORDER — SODIUM CHLORIDE 0.9 % IV SOLN: 250.0000 mL | INTRAVENOUS | Status: DC | PRN

## 2015-09-09 MED ORDER — INFLUENZA VAC SPLIT QUAD 0.5 ML IM SUSY
0.5000 mL | PREFILLED_SYRINGE | Freq: Once | INTRAMUSCULAR | Status: AC
  Administered 2015-09-09: 0.5 mL via INTRAMUSCULAR

## 2015-09-09 MED ORDER — SODIUM CHLORIDE 0.9 % IJ SOLN: 3.0000 mL | Freq: Two times a day (BID) | INTRAMUSCULAR | Status: DC

## 2015-09-09 MED ORDER — MAGNESIUM SULFATE 50 % IJ SOLN: 2.0000 g/h | INTRAVENOUS | Status: AC

## 2015-09-09 MED ORDER — SODIUM CHLORIDE 0.9 % IJ SOLN: 3.0000 mL | INTRAMUSCULAR | Status: DC | PRN

## 2015-09-09 NOTE — Progress Notes (Signed)
Babynot latching well.  Consider circ tomorrow.

## 2015-09-09 NOTE — Progress Notes (Signed)
Patient is eating, ambulating, voiding.  Pain control is good.  Filed Vitals:   09/09/15 0300 09/09/15 0400 09/09/15 0500 09/09/15 0602  BP: 131/62 129/72 128/69 144/88  Pulse: 101 97 96 91  Temp:    98.4 F (36.9 C)  TempSrc:    Oral  Resp: 16 16 18 18   Height:      Weight:      SpO2:    98%    Fundus firm Perineum without swelling.  Lab Results  Component Value Date   WBC 13.1* 09/09/2015   HGB 11.5* 09/09/2015   HCT 34.2* 09/09/2015   MCV 81.4 09/09/2015   PLT 241 09/09/2015    --/--/A POS, A POS (01/02 0125)/RI  A/P Post partum day 1, on magnesium sulfate- will d/c at 7 pm tonight at 24 hours postpartum.  Routine care otherwise- BPs stable.  Expect d/c tomorrow.   Parents desires circumsision.  All risks, benefits and alternatives discussed with the mother.  Teshawn Moan A

## 2015-09-09 NOTE — Anesthesia Postprocedure Evaluation (Signed)
Anesthesia Post Note  Patient: Melinda Powell  Procedure(s) Performed: * No procedures listed *  Patient location during evaluation: Antenatal Anesthesia Type: Epidural Level of consciousness: awake and alert Pain management: pain level controlled Vital Signs Assessment: post-procedure vital signs reviewed and stable Respiratory status: spontaneous breathing Cardiovascular status: stable Postop Assessment: no headache, no backache, epidural receding and patient able to bend at knees Anesthetic complications: no    Last Vitals:  Filed Vitals:   09/09/15 0602 09/09/15 0800  BP: 144/88 137/90  Pulse: 91 90  Temp: 36.9 C 36.8 C  Resp: 18 18    Last Pain:  Filed Vitals:   09/09/15 0808  PainSc: 3                  Edison PaceWILKERSON,Mailani Degroote

## 2015-09-09 NOTE — Lactation Note (Addendum)
This note was copied from the chart of Melinda Sharion BalloonChristina Meckes. Lactation Consultation Note  Patient Name: Melinda Powell ZOXWR'UToday's Date: 09/09/2015 Reason for consult: Follow-up assessment;Difficult latch  Baby 21 hours old. Asked to assist patient with latching baby. Mom reports that baby did nurse better the last time that she latched him--nursing well for 20 minutes. Assisted mom to hand express with colostrum flowing well. Supported mom's large, pendulous breast with rolled cloth diaper. Enc mom to support baby's head and "teacup" her breast to assist baby with latching deeply. Baby latched deeply to right breast in football position with lips flanged, suckling rhythmically with some swallows noted. Enc mom to hand express prior to latching in order to get baby to latch deeply. Enc mom to offer lots of STS and attempts at breast. Mom has DEBP and will post-pump as needed depending on how well baby nurses and swallows at breast. Enc mom to call for assistance with latching/nursing as needed.  Maternal Data    Feeding Feeding Type: Breast Fed Length of feed:  (Assessed first 10 minutes of BF. )  LATCH Score/Interventions Latch: Grasps breast easily, tongue down, lips flanged, rhythmical sucking. Intervention(s): Adjust position;Assist with latch;Breast compression  Audible Swallowing: A few with stimulation Intervention(s): Skin to skin;Hand expression Intervention(s): Skin to skin  Type of Nipple: Flat (Short shaft.) Intervention(s): Double electric pump  Comfort (Breast/Nipple): Soft / non-tender     Hold (Positioning): Assistance needed to correctly position infant at breast and maintain latch. Intervention(s): Breastfeeding basics reviewed;Support Pillows;Position options;Skin to skin  LATCH Score: 7  Lactation Tools Discussed/Used Tools: Pump Breast pump type: Double-Electric Breast Pump   Consult Status Consult Status: Follow-up Date: 09/10/15 Follow-up type:  In-patient    Geralynn OchsWILLIARD, Kang Ishida 09/09/2015, 3:58 PM

## 2015-09-09 NOTE — Lactation Note (Signed)
This note was copied from the chart of Melinda Sharion BalloonChristina Cuttino. Lactation Consultation Note New mom w/PIH, on mag. Having some difficulty BF. Baby sleepy this am. Is alert and BF well. Positioning and obtain deep latch demonstrated. BF in football position. Mom has large pendulum breast w/flat nipples. Elevated breast w/wash cloth, nipple and areola very compressible. Hand expression taught w/noted colostrum. Used sand which hold to latch baby. D/t IV and blood pressure cuff, hard to latch baby at this time. Mom also has short arms, hard to reach over across body with opposite arm. Used props to elevate and position baby. Mom encouraged to feed baby 8-12 times/24 hours and with feeding cues. Referred to Baby and Me Book in Breastfeeding section Pg. 22-23 for position options and Proper latch demonstration.Mom encouraged to do skin-to-skin. Educated about newborn behavior, I&O, cluster feeding, supply and demand. Mom shown how to use DEBP & how to disassemble, clean, & reassemble parts.Mom knows to pump q3h for 15-20 min.WH/LC brochure given w/resources, support groups and LC services. Patient Name: Melinda Powell's Date: 09/09/2015 Reason for consult: Initial assessment   Maternal Data Has patient been taught Hand Expression?: Yes Does the patient have breastfeeding experience prior to this delivery?: No  Feeding Feeding Type: Breast Fed Length of feed: 15 min (still BF)  LATCH Score/Interventions Latch: Repeated attempts needed to sustain latch, nipple held in mouth throughout feeding, stimulation needed to elicit sucking reflex. Intervention(s): Adjust position;Assist with latch;Breast massage;Breast compression  Audible Swallowing: A few with stimulation Intervention(s): Skin to skin;Hand expression Intervention(s): Skin to skin;Hand expression;Alternate breast massage  Type of Nipple: Flat Intervention(s): Hand pump;Double electric pump  Comfort (Breast/Nipple): Soft /  non-tender     Hold (Positioning): Assistance needed to correctly position infant at breast and maintain latch. Intervention(s): Breastfeeding basics reviewed;Support Pillows;Position options;Skin to skin  LATCH Score: 6  Lactation Tools Discussed/Used Tools: Pump Breast pump type: Double-Electric Breast Pump WIC Program: Yes Pump Review: Setup, frequency, and cleaning;Milk Storage Initiated by:: Peri JeffersonL. Clarice Zulauf RN Date initiated:: 09/09/15   Consult Status Consult Status: Follow-up Date: 09/10/15 Follow-up type: In-patient    Zephyra Bernardi, Diamond NickelLAURA G 09/09/2015, 10:15 AM

## 2015-09-10 LAB — GLUCOSE, CAPILLARY: Glucose-Capillary: 74 mg/dL (ref 65–99)

## 2015-09-10 MED ORDER — OXYCODONE-ACETAMINOPHEN 5-325 MG PO TABS: 1.0000 | ORAL_TABLET | Freq: Four times a day (QID) | ORAL | Status: DC | PRN

## 2015-09-10 MED ORDER — IBUPROFEN 600 MG PO TABS: 600.0000 mg | ORAL_TABLET | Freq: Four times a day (QID) | ORAL | Status: DC

## 2015-09-10 NOTE — Progress Notes (Signed)
Patient discharged... Baby has now become baby patient, so mom will be staying overnight rooming in with baby... Condition stable... Discharge instructions reviewed with patient and she verbalized understanding.

## 2015-09-10 NOTE — Discharge Summary (Signed)
Obstetric Discharge Summary Reason for Admission: induction of labor Prenatal Procedures: none Intrapartum Procedures: vacuum Postpartum Procedures: none Complications-Operative and Postpartum: 2 degree perineal laceration, severe hypertension HEMOGLOBIN  Date Value Ref Range Status  09/09/2015 11.5* 12.0 - 15.0 g/dL Final   HCT  Date Value Ref Range Status  09/09/2015 34.2* 36.0 - 46.0 % Final    Physical Exam:  General: alert, cooperative and appears stated age 49Lochia: appropriate Uterine Fundus: firm DVT Evaluation: No evidence of DVT seen on physical exam.  Discharge Diagnoses: Term Pregnancy-delivered and Preelampsia  Discharge Information: Date: 09/10/2015 Activity: pelvic rest Diet: routine Medications: PNV, Ibuprofen and Percocet Condition: stable Instructions: refer to practice specific booklet Discharge to: home Follow-up Information    Follow up with Powell, SIDNEY, DO In 1 week.   Specialty:  Obstetrics and Gynecology   Why:  BP check   Contact information:   [address] [place] [id number] [phone]       Newborn Data: Live born female  Birth Weight: 6 lb 12 oz (3062 g) APGAR: 8, 9  Home with mother.  [facility] CenterDYANNA Powell Melinda Powell 09/10/2015, 5:53 PM

## 2015-09-10 NOTE — Progress Notes (Signed)
Patient is eating, ambulating, voiding.  Pain control is good.  Lochia minimal. Magnesium stopped yesterday evening, BPs mild range at this time, patient asymptoamtic  Filed Vitals:   09/09/15 2243 09/09/15 2340 09/10/15 0015 09/10/15 0623  BP: 140/66 142/72 132/69 125/64  Pulse: 99 96 91 78  Temp:   98.1 F (36.7 C) 97.9 F (36.6 C)  TempSrc:   Oral Oral  Resp: 18 18 18 16   Height:      Weight:      SpO2: 98% 98% 99% 98%    Fundus firm Perineum without swelling.  Lab Results  Component Value Date   WBC 13.1* 09/09/2015   HGB 11.5* 09/09/2015   HCT 34.2* 09/09/2015   MCV 81.4 09/09/2015   PLT 241 09/09/2015    --/--/A POS, A POS (01/02 0125)/RI  A/P Post partum day 2 s/p TSVD w GDM and severe hypertension, s/p 24 hr PP magnesium BPs mild range--will monitor throughout the day today--if remain mild range, d/c this PM GDM--fasting BG 74 this AM--2hr gtt PP   Desires circumcision. Discussed r/b/a of the procedure. Reviewed that circumcision is an elective surgical procedure and not considered medically necessary. Reviewed the risks of the procedure including the risk of infection, bleeding, damage to surrounding structures, including scrotum, shaft, urethra and head of penis, and an undesired cosmetic effect requiring additional procedures for revision. Consent signed.   [facility] Center[facility]ANNA Powell [employer] Tim[employer] CompanyCLARK

## 2015-09-10 NOTE — Lactation Note (Signed)
This note was copied from the chart of Melinda Sharion BalloonChristina Rathje. Lactation Consultation Note  Follow up visit made.  Mom states baby is latching easily since assist yesterday.  She is post pumping and supplementing with formula when baby not full after feedings.  Discussed milk coming to volume.  Offered assist with latching baby today if needed.  Encouraged to call prn.  Patient Name: Melinda Powell ZOXWR'UToday'Powell Date: 09/10/2015     Maternal Data    Feeding    LATCH Score/Interventions                      Lactation Tools Discussed/Used     Consult Status      [name], Melinda Powell 09/10/2015, 11:34 AM

## 2015-09-11 ENCOUNTER — Ambulatory Visit: Payer: Self-pay

## 2015-09-11 NOTE — Lactation Note (Signed)
This note was copied from the chart of Boy Sharion BalloonChristina Lamp. Lactation Consultation Note  Patient Name: Boy Sharion BalloonChristina Cyran ZOXWR'UToday's Date: 09/11/2015 Reason for consult: Follow-up assessment   With this mom and early term baby, now 7063 hours old, and now 6337 6/7 weeks CGA. The baby is till under double phototherapy. Mom has been just formula feeding. I advised her to pump every 3 hours, since her milk will becoming in now, and to feed EBM to baby , each times she feeds , prior to formula. Mom agreed to this, and I left her pumping, with her milk transitioning in from her right breast. 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 I also told mom to call her insurance company tio get a DEP, and that she could rent a DEP for $40.00 /2 weeks, at discharge. Mom very receptive to my teaching. i told her to call for questions/cpncerns.    Maternal Data    Feeding    LATCH Score/Interventions                      Lactation Tools Discussed/Used Pump Review: Setup, frequency, and cleaning   Consult Status Consult Status: Follow-up Date: 09/12/15 Follow-up type: In-patient    Alfred LevinsLee, Zanden Colver Anne 09/11/2015, 10:14 AM

## 2015-09-12 ENCOUNTER — Ambulatory Visit: Payer: Self-pay

## 2015-09-12 NOTE — Lactation Note (Signed)
This note was copied from the chart of Melinda Sharion BalloonChristina Tungate. Lactation Consultation Note  Patient Name: Melinda Powell MWUXL'KToday's Date: 09/12/2015 Reason for consult: Follow-up assessment Infant is 5285 hours old & seen by lactation for follow-up. Mom reports the last time she pumped was ~10:30pm last night (~10 hours ago) and she was able to get 35mL. Mom reports she has not BF because she states her breasts are swollen but has not been wearing a bra as encouraged by another Advertising copywriterlactation consultant. Mom reported no problems with pumping but that she is sore after taking a shower and feels full. Encouraged mom to pump q 3hrs to help prevent engorgement & help her milk supply. Mom reports no ?s at this time. Encouraged mom to ask for lactation if has questions later or needs help with BF or pumping.  Maternal Data    Feeding Feeding Type: Bottle Fed - Formula Nipple Type: Slow - flow  LATCH Score/Interventions                      Lactation Tools Discussed/Used     Consult Status Consult Status: Follow-up Date: 09/13/15 Follow-up type: In-patient    Oneal GroutLaura C Rechelle Niebla 09/12/2015, 8:29 AM

## 2015-09-13 ENCOUNTER — Ambulatory Visit: Payer: Self-pay

## 2015-09-13 NOTE — Lactation Note (Signed)
This note was copied from the chart of [place]. Lactation Consultation Note  Patient Name: Melinda Powell DHWYS'H Date: 09/13/2015 Reason for consult: Follow-up assessment;Other (Comment);Pump rental ([place] - see [place] note , 2% weight loss, Double Photo D/C this am , )  Per mom has a appt. With [employer] this week.  Prior to [facility] visit mom had pumped off approx 60 ml with the DEBP. LC recommended [facility] loaner due to her milk being in.  Mom filled out paper work and grandmother brought up $100.00 dollar bill. LC unable to give change.  LC recommended for mom to go ahead and be discharged, and to go obtain change for the $100.00 and mom could call [place] back and she would meet  Her at the [facility] office to loan her the DEBP. Mom declined recommendation and said she would just come up tomorrow to obtain the pump .  Mom has her pump paper work.  Sore nipple and engorgement prevention reviewed. Mom has the DEBP kit set up and a hand pump. Per mom has been latching some also.  Mother informed of post-discharge support and given phone number to the lactation department, including services for phone call assistance; out-patient appointments; and breastfeeding support group. List of other breastfeeding resources in the community given in the handout. Encouraged mother to call for problems or concerns related to breastfeeding.    Maternal Data    Feeding Feeding Type:  (per mom last fed at 6837 - from Bottle )  LATCH Score/Interventions                      Lactation Tools Discussed/Used Tools: Pump Breast pump type: Double-Electric Breast Pump (per mom recently pumped approx 60 ml . )   Consult Status      [name] 09/13/2015, 3:12 PM

## 2016-06-12 ENCOUNTER — Inpatient Hospital Stay (HOSPITAL_COMMUNITY)
Admission: AD | Admit: 2016-06-12 | Discharge: 2016-06-12 | Disposition: A | Discharge status: Home / Self Care | Payer: Managed Care, Other (non HMO) | Source: Ambulatory Visit | Attending: Obstetrics & Gynecology | Admitting: Obstetrics & Gynecology

## 2016-06-12 ENCOUNTER — Encounter (HOSPITAL_COMMUNITY): Payer: Self-pay | Admitting: *Deleted

## 2016-06-12 ENCOUNTER — Inpatient Hospital Stay (HOSPITAL_COMMUNITY): Payer: Managed Care, Other (non HMO)

## 2016-06-12 DIAGNOSIS — O23591 Infection of other part of genital tract in pregnancy, first trimester: Secondary | ICD-10-CM

## 2016-06-12 DIAGNOSIS — Z3A01 Less than 8 weeks gestation of pregnancy: Secondary | ICD-10-CM | POA: Insufficient documentation

## 2016-06-12 DIAGNOSIS — R109 Unspecified abdominal pain: Secondary | ICD-10-CM | POA: Diagnosis present

## 2016-06-12 DIAGNOSIS — B9689 Other specified bacterial agents as the cause of diseases classified elsewhere: Secondary | ICD-10-CM

## 2016-06-12 DIAGNOSIS — R102 Pelvic and perineal pain: Secondary | ICD-10-CM | POA: Diagnosis not present

## 2016-06-12 DIAGNOSIS — O26899 Other specified pregnancy related conditions, unspecified trimester: Secondary | ICD-10-CM

## 2016-06-12 DIAGNOSIS — B373 Candidiasis of vulva and vagina: Secondary | ICD-10-CM | POA: Insufficient documentation

## 2016-06-12 DIAGNOSIS — Z87891 Personal history of nicotine dependence: Secondary | ICD-10-CM | POA: Diagnosis not present

## 2016-06-12 DIAGNOSIS — N760 Acute vaginitis: Secondary | ICD-10-CM

## 2016-06-12 DIAGNOSIS — O98811 Other maternal infectious and parasitic diseases complicating pregnancy, first trimester: Secondary | ICD-10-CM | POA: Diagnosis not present

## 2016-06-12 LAB — URINALYSIS, ROUTINE W REFLEX MICROSCOPIC
Bilirubin Urine: NEGATIVE
GLUCOSE, UA: NEGATIVE mg/dL
HGB URINE DIPSTICK: NEGATIVE
KETONES UR: NEGATIVE mg/dL
LEUKOCYTES UA: NEGATIVE
Nitrite: NEGATIVE
PROTEIN: NEGATIVE mg/dL
Specific Gravity, Urine: >1.03 — ABNORMAL HIGH (ref 1.005–1.030)
pH: 5.5 (ref 5.0–8.0)

## 2016-06-12 LAB — CBC
HEMATOCRIT: 33.7 % — AB (ref 36.0–46.0)
Hemoglobin: 11.3 g/dL — ABNORMAL LOW (ref 12.0–15.0)
MCH: 26.8 pg (ref 26.0–34.0)
MCHC: 33.5 g/dL (ref 30.0–36.0)
MCV: 80 fL (ref 78.0–100.0)
Platelets: 262 10*3/uL (ref 150–400)
RBC: 4.21 MIL/uL (ref 3.87–5.11)
RDW: 15.6 % — ABNORMAL HIGH (ref 11.5–15.5)
WBC: 7.1 10*3/uL (ref 4.0–10.5)

## 2016-06-12 LAB — WET PREP, GENITAL
Sperm: NONE SEEN
TRICH WET PREP: NONE SEEN
Yeast Wet Prep HPF POC: NONE SEEN

## 2016-06-12 LAB — HCG, QUANTITATIVE, PREGNANCY: hCG, Beta Chain, Quant, S: 3269 m[IU]/mL — ABNORMAL HIGH (ref <5)

## 2016-06-12 LAB — POCT PREGNANCY, URINE: PREG TEST UR: POSITIVE — AB

## 2016-06-12 NOTE — MAU Provider Note (Signed)
Chief Complaint: Abdominal Pain   First Provider Initiated Contact with Patient 06/12/16 1324        SUBJECTIVE HPI: Melinda Powell is a 20 y.o. G4P1021 at 8412w1d by LMP who presents to maternity admissions reporting Left lower quadrant pain for 2-3 weeks.  Had a positive pregnancy test this week. . She denies vaginal bleeding, vaginal itching/burning, urinary symptoms, h/a, dizziness, n/v, or fever/chills.    Abdominal Cramping  This is a new problem. The current episode started today. The onset quality is gradual. The problem occurs intermittently. The problem has been unchanged. The pain is located in the LLQ. The pain is mild. The quality of the pain is cramping. The abdominal pain does not radiate. Pertinent negatives include no constipation, diarrhea, fever, headaches, nausea or vomiting. Nothing aggravates the pain. The pain is relieved by nothing. She has tried nothing for the symptoms.   RN Note: Patient presents with LLQ pain x 2 to 3 weeks which comes and goes,  positive pregnancy test at home. Denies vaginal discharge or bleeding.   Past Medical History:  Diagnosis Date  . Anemia   . Gestational diabetes    glyburide  . Hx of varicella   . Hypertension   . Miscarriage    Past Surgical History:  Procedure Laterality Date  . NO PAST SURGERIES     Social History   Social History  . Marital status: Single    Spouse name: N/A  . Number of children: N/A  . Years of education: N/A   Occupational History  . Not on file.   Social History Main Topics  . Smoking status: Former Smoker    Types: Cigarettes    Quit date: 01/23/2015  . Smokeless tobacco: Never Used  . Alcohol use No  . Drug use: No  . Sexual activity: Yes    Birth control/ protection: None   Other Topics Concern  . Not on file   Social History Narrative  . No narrative on file   No current facility-administered medications on file prior to encounter.    No current outpatient prescriptions on  file prior to encounter.   No Known Allergies  I have reviewed patient's Past Medical Hx, Surgical Hx, Family Hx, Social Hx, medications and allergies.   ROS:  Review of Systems  Constitutional: Negative for chills and fever.  Gastrointestinal: Negative for constipation, diarrhea, nausea and vomiting.  Genitourinary: Positive for pelvic pain. Negative for vaginal bleeding and vaginal discharge.  Neurological: Negative for headaches.   Review of Systems  Other systems negative   Physical Exam  Patient Vitals for the past 24 hrs:  BP Temp Temp src Pulse Resp Height Weight  06/12/16 1211 - 98.6 F (37 C) Oral - - - -  06/12/16 1207 132/74 - Oral 89 18 - -  06/12/16 1203 - - - - - 5\' 6"  (1.676 m) 239 lb 1 oz (108.4 kg)    Physical Exam  Constitutional: Well-developed, well-nourished female in no acute distress.  Cardiovascular: normal rate Respiratory: normal effort GI: Abd soft, non-tender. Pos BS x 4 MS: Extremities nontender, no edema, normal ROM Neurologic: Alert and oriented x 4.  GU: Neg CVAT.  PELVIC EXAM: Cervix pink, visually closed, without lesion, scant white creamy discharge, vaginal walls and external genitalia normal Bimanual exam: Cervix 0/long/high, firm, anterior, neg CMT, uterus nontender, nonenlarged, adnexa without tenderness, enlargement, or mass on right, slightly tender on left   LAB RESULTS Results for orders placed or performed during the  hospital encounter of 06/12/16 (from the past 72 hour(s))  GC/Chlamydia probe amp ([place])not at [facility]     Status: None   Collection Time: 06/12/16 12:00 AM  Result Value Ref Range   Chlamydia Negative     Comment: Normal Reference Range - Negative   Neisseria gonorrhea Negative     Comment: Normal Reference Range - Negative  Urinalysis, Routine w reflex microscopic (not at [facility])     Status: Abnormal   Collection Time: 06/12/16 12:00 PM  Result Value Ref Range   Color, Urine YELLOW YELLOW   APPearance  CLEAR CLEAR   Specific Gravity, Urine >1.030 (H) 1.005 - 1.030   pH 5.5 5.0 - 8.0   Glucose, UA NEGATIVE NEGATIVE mg/dL   Hgb urine dipstick NEGATIVE NEGATIVE   Bilirubin Urine NEGATIVE NEGATIVE   Ketones, ur NEGATIVE NEGATIVE mg/dL   Protein, ur NEGATIVE NEGATIVE mg/dL   Nitrite NEGATIVE NEGATIVE   Leukocytes, UA NEGATIVE NEGATIVE    Comment: MICROSCOPIC NOT DONE ON URINES WITH NEGATIVE PROTEIN, BLOOD, LEUKOCYTES, NITRITE, OR GLUCOSE <1000 mg/dL.  Pregnancy, urine POC     Status: Abnormal   Collection Time: 06/12/16 12:05 PM  Result Value Ref Range   Preg Test, Ur POSITIVE (A) NEGATIVE    Comment:        THE SENSITIVITY OF THIS METHODOLOGY IS >24 mIU/mL   CBC     Status: Abnormal   Collection Time: 06/12/16 12:52 PM  Result Value Ref Range   WBC 7.1 4.0 - 10.5 K/uL   RBC 4.21 3.87 - 5.11 MIL/uL   Hemoglobin 11.3 (L) 12.0 - 15.0 g/dL   HCT 19.1 (L) 47.8 - 29.5 %   MCV 80.0 78.0 - 100.0 fL   MCH 26.8 26.0 - 34.0 pg   MCHC 33.5 30.0 - 36.0 g/dL   RDW 62.1 (H) 30.8 - 65.7 %   Platelets 262 150 - 400 K/uL  hCG, quantitative, pregnancy     Status: Abnormal   Collection Time: 06/12/16 12:52 PM  Result Value Ref Range   hCG, Beta Chain, Quant, S 3,269 (H) <5 mIU/mL    Comment:          GEST. AGE      CONC.  (mIU/mL)   <=1 WEEK        5 - 50     2 WEEKS       50 - 500     3 WEEKS       100 - 10,000     4 WEEKS     1,000 - 30,000     5 WEEKS     3,500 - 115,000   6-8 WEEKS     12,000 - 270,000    12 WEEKS     15,000 - 220,000        FEMALE AND NON-PREGNANT FEMALE:     LESS THAN 5 mIU/mL   HIV antibody (routine testing) (NOT for [facility])     Status: None   Collection Time: 06/12/16 12:52 PM  Result Value Ref Range   HIV Screen 4th Generation wRfx Non Reactive Non Reactive    Comment: (NOTE) Performed At: [facility] [address] [place], [place] [id number] Mila Homer MD WU:[id number]   Wet prep, genital     Status: Abnormal   Collection Time: 06/12/16  1:30  PM  Result Value Ref Range   Yeast Wet Prep HPF POC NONE SEEN NONE SEEN   Trich, Wet Prep NONE SEEN NONE  SEEN   Clue Cells Wet Prep HPF POC PRESENT (A) NONE SEEN   WBC, Wet Prep HPF POC FEW (A) NONE SEEN    Comment: MODERATE BACTERIA SEEN   Sperm NONE SEEN     --/--/A POS, A POS (01/02 0125)  IMAGING US Ob Comp Less 14 Wks  Result Date: 06/12/2016 CLINICAL DATA:  Left lower quadrant pain since 05/29/2016. Patient is 5 weeks and 1 day pregnant based on her last menstrual period. EXAM: OBSTETRIC <14 WK [place] AND TRANSVAGINAL OB US TECHNIQUE: Both transabdominal and transvaginal ultrasound examinations were performed for complete evaluation of the gestation as well as the maternal uterus, adnexal regions, and pelvic cul-de-sac. Transvaginal technique was performed to assess early pregnancy. COMPARISON:  None. FINDINGS: Intrauterine gestational sac: Yes Yolk sac:  No Embryo:  No MSD: 5.0  mm   5 w   2  d Subchorionic hemorrhage:  None visualized. Maternal uterus/adnexae: No uterine masses. Closed cervix. Normal ovaries and adnexa. No abnormal pelvic free fluid. IMPRESSION: Probable early intrauterine gestational sac, but no yolk sac, fetal pole, or cardiac activity yet visualized. Recommend follow-up quantitative B-HCG levels and follow-up US in 14 days to confirm and assess viability. This recommendation follows SRU consensus guidelines: Diagnostic Criteria for Nonviable Pregnancy Early in the First Trimester. Malva Limes Med 2013; 409:8119-14. No evidence of a pregnancy complication. No acute abnormalities. Normal adnexa. Electronically Signed   By: Amie Portland M.D.   On: 06/12/2016 14:11   US Ob Transvaginal  Result Date: 06/12/2016 CLINICAL DATA:  Left lower quadrant pain since 05/29/2016. Patient is 5 weeks and 1 day pregnant based on her last menstrual period. EXAM: OBSTETRIC <14 WK [place] AND TRANSVAGINAL OB US TECHNIQUE: Both transabdominal and transvaginal ultrasound examinations were performed for  complete evaluation of the gestation as well as the maternal uterus, adnexal regions, and pelvic cul-de-sac. Transvaginal technique was performed to assess early pregnancy. COMPARISON:  None. FINDINGS: Intrauterine gestational sac: Yes Yolk sac:  No Embryo:  No MSD: 5.0  mm   5 w   2  d Subchorionic hemorrhage:  None visualized. Maternal uterus/adnexae: No uterine masses. Closed cervix. Normal ovaries and adnexa. No abnormal pelvic free fluid. IMPRESSION: Probable early intrauterine gestational sac, but no yolk sac, fetal pole, or cardiac activity yet visualized. Recommend follow-up quantitative B-HCG levels and follow-up US in 14 days to confirm and assess viability. This recommendation follows SRU consensus guidelines: Diagnostic Criteria for Nonviable Pregnancy Early in the First Trimester. Malva Limes Med 2013; 782:9562-13. No evidence of a pregnancy complication. No acute abnormalities. Normal adnexa. Electronically Signed   By: Amie Portland M.D.   On: 06/12/2016 14:11    MAU Management/MDM: Ordered usual first trimester r/o ectopic labs.   Pelvic exam and cultures done Will check baseline Ultrasound to rule out ectopic.  This bleeding/pain can represent a normal pregnancy with bleeding, spontaneous abortion or even an ectopic which can be life-threatening.  The process as listed above helps to determine which of these is present.  Consulted Dr Despina Hidden with presentation and exam findings.  He recommends repeating US in a week Results discussed with patient. Cannot rule out ectopic just yet. Ectopic precautions reviewed.   Will defer treatment for BV until viable pregnancy established  ASSESSMENT 1. Pelvic pain affecting pregnancy   2. Pelvic pain affecting pregnancy   3.    Bacterial vaginosis  PLAN Discharge home Will repeat  Ultrasound in about 7 days and followup results in clinic Ectopic  precautions  Pt stable at time of discharge. Encouraged to return here or to other Urgent Care/ED if  she develops worsening of symptoms, increase in pain, fever, or other concerning symptoms.    Wynelle Bourgeois CNM, MSN Certified Nurse-Midwife 06/12/2016  1:33 PM

## 2016-06-12 NOTE — MAU Note (Addendum)
Patient presents with LLQ pain x 2 to 3 weeks which comes and goes,  positive pregnancy test at home. Denies vaginal discharge or bleeding.

## 2016-06-12 NOTE — Discharge Instructions (Signed)
First Trimester of Pregnancy The first trimester of pregnancy is from week 1 until the end of week 12 (months 1 through 3). A week after a sperm fertilizes an egg, the egg will implant on the wall of the uterus. This embryo will begin to develop into a baby. Genes from you and your partner are forming the baby. The female genes determine whether the baby is a boy or a girl. At 6-8 weeks, the eyes and face are formed, and the heartbeat can be seen on ultrasound. At the end of 12 weeks, all the baby's organs are formed.  Now that you are pregnant, you will want to do everything you can to have a healthy baby. Two of the most important things are to get good prenatal care and to follow your health care provider's instructions. Prenatal care is all the medical care you receive before the baby's birth. This care will help prevent, find, and treat any problems during the pregnancy and childbirth. BODY CHANGES Your body goes through many changes during pregnancy. The changes vary from woman to woman.   You may gain or lose a couple of pounds at first.  You may feel sick to your stomach (nauseous) and throw up (vomit). If the vomiting is uncontrollable, call your health care provider.  You may tire easily.  You may develop headaches that can be relieved by medicines approved by your health care provider.  You may urinate more often. Painful urination may mean you have a bladder infection.  You may develop heartburn as a result of your pregnancy.  You may develop constipation because certain hormones are causing the muscles that push waste through your intestines to slow down.  You may develop hemorrhoids or swollen, bulging veins (varicose veins).  Your breasts may begin to grow larger and become tender. Your nipples may stick out more, and the tissue that surrounds them (areola) may become darker.  Your gums may bleed and may be sensitive to brushing and flossing.  Dark spots or blotches (chloasma,  mask of pregnancy) may develop on your face. This will likely fade after the baby is born.  Your menstrual periods will stop.  You may have a loss of appetite.  You may develop cravings for certain kinds of food.  You may have changes in your emotions from day to day, such as being excited to be pregnant or being concerned that something may go wrong with the pregnancy and baby.  You may have more vivid and strange dreams.  You may have changes in your hair. These can include thickening of your hair, rapid growth, and changes in texture. Some women also have hair loss during or after pregnancy, or hair that feels dry or thin. Your hair will most likely return to normal after your baby is born. WHAT TO EXPECT AT YOUR PRENATAL VISITS During a routine prenatal visit:  You will be weighed to make sure you and the baby are growing normally.  Your blood pressure will be taken.  Your abdomen will be measured to track your baby's growth.  The fetal heartbeat will be listened to starting around week 10 or 12 of your pregnancy.  Test results from any previous visits will be discussed. Your health care provider may ask you:  How you are feeling.  If you are feeling the baby move.  If you have had any abnormal symptoms, such as leaking fluid, bleeding, severe headaches, or abdominal cramping.  If you are using any tobacco products,   including cigarettes, chewing tobacco, and electronic cigarettes.  If you have any questions. Other tests that may be performed during your first trimester include:  Blood tests to find your blood type and to check for the presence of any previous infections. They will also be used to check for low iron levels (anemia) and Rh antibodies. Later in the pregnancy, blood tests for diabetes will be done along with other tests if problems develop.  Urine tests to check for infections, diabetes, or protein in the urine.  An ultrasound to confirm the proper growth  and development of the baby.  An amniocentesis to check for possible genetic problems.  Fetal screens for spina bifida and Down syndrome.  You may need other tests to make sure you and the baby are doing well.  HIV (human immunodeficiency virus) testing. Routine prenatal testing includes screening for HIV, unless you choose not to have this test. HOME CARE INSTRUCTIONS  Medicines  Follow your health care provider's instructions regarding medicine use. Specific medicines may be either safe or unsafe to take during pregnancy.  Take your prenatal vitamins as directed.  If you develop constipation, try taking a stool softener if your health care provider approves. Diet  Eat regular, well-balanced meals. Choose a variety of foods, such as meat or vegetable-based protein, fish, milk and low-fat dairy products, vegetables, fruits, and whole grain breads and cereals. Your health care provider will help you determine the amount of weight gain that is right for you.  Avoid raw meat and uncooked cheese. These carry germs that can cause birth defects in the baby.  Eating four or five small meals rather than three large meals a day may help relieve nausea and vomiting. If you start to feel nauseous, eating a few soda crackers can be helpful. Drinking liquids between meals instead of during meals also seems to help nausea and vomiting.  If you develop constipation, eat more high-fiber foods, such as fresh vegetables or fruit and whole grains. Drink enough fluids to keep your urine clear or pale yellow. Activity and Exercise  Exercise only as directed by your health care provider. Exercising will help you:  Control your weight.  Stay in shape.  Be prepared for labor and delivery.  Experiencing pain or cramping in the lower abdomen or low back is a good sign that you should stop exercising. Check with your health care provider before continuing normal exercises.  Try to avoid standing for long  periods of time. Move your legs often if you must stand in one place for a long time.  Avoid heavy lifting.  Wear low-heeled shoes, and practice good posture.  You may continue to have sex unless your health care provider directs you otherwise. Relief of Pain or Discomfort  Wear a good support bra for breast tenderness.   Take warm sitz baths to soothe any pain or discomfort caused by hemorrhoids. Use hemorrhoid cream if your health care provider approves.   Rest with your legs elevated if you have leg cramps or low back pain.  If you develop varicose veins in your legs, wear support hose. Elevate your feet for 15 minutes, 3-4 times a day. Limit salt in your diet. Prenatal Care  Schedule your prenatal visits by the twelfth week of pregnancy. They are usually scheduled monthly at first, then more often in the last 2 months before delivery.  Write down your questions. Take them to your prenatal visits.  Keep all your prenatal visits as directed by your   health care provider. Safety  Wear your seat belt at all times when driving.  Make a list of emergency phone numbers, including numbers for family, friends, the hospital, and police and fire departments. General Tips  Ask your health care provider for a referral to a local prenatal education class. Begin classes no later than at the beginning of month 6 of your pregnancy.  Ask for help if you have counseling or nutritional needs during pregnancy. Your health care provider can offer advice or refer you to specialists for help with various needs.  Do not use hot tubs, steam rooms, or saunas.  Do not douche or use tampons or scented sanitary pads.  Do not cross your legs for long periods of time.  Avoid cat litter boxes and soil used by cats. These carry germs that can cause birth defects in the baby and possibly loss of the fetus by miscarriage or stillbirth.  Avoid all smoking, herbs, alcohol, and medicines not prescribed by  your health care provider. Chemicals in these affect the formation and growth of the baby.  Do not use any tobacco products, including cigarettes, chewing tobacco, and electronic cigarettes. If you need help quitting, ask your health care provider. You may receive counseling support and other resources to help you quit.  Schedule a dentist appointment. At home, brush your teeth with a soft toothbrush and be gentle when you floss. SEEK MEDICAL CARE IF:   You have dizziness.  You have mild pelvic cramps, pelvic pressure, or nagging pain in the abdominal area.  You have persistent nausea, vomiting, or diarrhea.  You have a bad smelling vaginal discharge.  You have pain with urination.  You notice increased swelling in your face, hands, legs, or ankles. SEEK IMMEDIATE MEDICAL CARE IF:   You have a fever.  You are leaking fluid from your vagina.  You have spotting or bleeding from your vagina.  You have severe abdominal cramping or pain.  You have rapid weight gain or loss.  You vomit blood or material that looks like coffee grounds.  You are exposed to German measles and have never had them.  You are exposed to fifth disease or chickenpox.  You develop a severe headache.  You have shortness of breath.  You have any kind of trauma, such as from a fall or a car accident.   This information is not intended to replace advice given to you by your health care provider. Make sure you discuss any questions you have with your health care provider.   Document Released: 08/16/2001 Document Revised: 09/12/2014 Document Reviewed: 07/02/2013 Elsevier Interactive Patient Education 2016 [employer]  

## 2016-06-13 LAB — GC/CHLAMYDIA PROBE AMP (~~LOC~~) NOT AT ARMC
Chlamydia: NEGATIVE
Neisseria Gonorrhea: NEGATIVE

## 2016-06-13 LAB — HIV ANTIBODY (ROUTINE TESTING W REFLEX): HIV SCREEN 4TH GENERATION: NONREACTIVE

## 2017-04-17 ENCOUNTER — Encounter (HOSPITAL_COMMUNITY): Payer: Self-pay

## 2017-10-27 ENCOUNTER — Encounter (HOSPITAL_COMMUNITY): Payer: Self-pay | Admitting: Emergency Medicine

## 2017-10-27 ENCOUNTER — Emergency Department (HOSPITAL_COMMUNITY)
Admission: EM | Admit: 2017-10-27 | Discharge: 2017-10-27 | Disposition: A | Discharge status: Home / Self Care | Payer: 59 | Attending: Emergency Medicine | Admitting: Emergency Medicine

## 2017-10-27 ENCOUNTER — Emergency Department (HOSPITAL_COMMUNITY): Payer: 59

## 2017-10-27 ENCOUNTER — Other Ambulatory Visit: Payer: Self-pay

## 2017-10-27 DIAGNOSIS — Y999 Unspecified external cause status: Secondary | ICD-10-CM | POA: Insufficient documentation

## 2017-10-27 DIAGNOSIS — S0181XA Laceration without foreign body of other part of head, initial encounter: Secondary | ICD-10-CM | POA: Diagnosis present

## 2017-10-27 DIAGNOSIS — Y939 Activity, unspecified: Secondary | ICD-10-CM | POA: Insufficient documentation

## 2017-10-27 DIAGNOSIS — F4325 Adjustment disorder with mixed disturbance of emotions and conduct: Secondary | ICD-10-CM | POA: Diagnosis not present

## 2017-10-27 DIAGNOSIS — R45851 Suicidal ideations: Secondary | ICD-10-CM

## 2017-10-27 DIAGNOSIS — Z87891 Personal history of nicotine dependence: Secondary | ICD-10-CM | POA: Diagnosis not present

## 2017-10-27 DIAGNOSIS — T50902A Poisoning by unspecified drugs, medicaments and biological substances, intentional self-harm, initial encounter: Secondary | ICD-10-CM | POA: Insufficient documentation

## 2017-10-27 DIAGNOSIS — W01198A Fall on same level from slipping, tripping and stumbling with subsequent striking against other object, initial encounter: Secondary | ICD-10-CM | POA: Insufficient documentation

## 2017-10-27 DIAGNOSIS — T50904A Poisoning by unspecified drugs, medicaments and biological substances, undetermined, initial encounter: Secondary | ICD-10-CM

## 2017-10-27 DIAGNOSIS — Y92009 Unspecified place in unspecified non-institutional (private) residence as the place of occurrence of the external cause: Secondary | ICD-10-CM | POA: Diagnosis not present

## 2017-10-27 LAB — COMPREHENSIVE METABOLIC PANEL
ALBUMIN: 4.2 g/dL (ref 3.5–5.0)
ALK PHOS: 50 U/L (ref 38–126)
ALT: 12 U/L — ABNORMAL LOW (ref 14–54)
ANION GAP: 12 (ref 5–15)
AST: 15 U/L (ref 15–41)
BUN: 19 mg/dL (ref 6–20)
CHLORIDE: 108 mmol/L (ref 101–111)
CO2: 20 mmol/L — AB (ref 22–32)
Calcium: 9.1 mg/dL (ref 8.9–10.3)
Creatinine, Ser: 0.72 mg/dL (ref 0.44–1.00)
GFR calc non Af Amer: >60 mL/min (ref >60)
GLUCOSE: 100 mg/dL — AB (ref 65–99)
POTASSIUM: 3.6 mmol/L (ref 3.5–5.1)
Sodium: 140 mmol/L (ref 135–145)
Total Bilirubin: 0.3 mg/dL (ref 0.3–1.2)
Total Protein: 7.1 g/dL (ref 6.5–8.1)

## 2017-10-27 LAB — RAPID URINE DRUG SCREEN, HOSP PERFORMED
Amphetamines: NOT DETECTED
BARBITURATES: NOT DETECTED
BENZODIAZEPINES: NOT DETECTED
COCAINE: NOT DETECTED
OPIATES: NOT DETECTED
TETRAHYDROCANNABINOL: POSITIVE — AB

## 2017-10-27 LAB — CBC
HEMATOCRIT: 36.1 % (ref 36.0–46.0)
HEMOGLOBIN: 11.6 g/dL — AB (ref 12.0–15.0)
MCH: 25.7 pg — ABNORMAL LOW (ref 26.0–34.0)
MCHC: 32.1 g/dL (ref 30.0–36.0)
MCV: 80 fL (ref 78.0–100.0)
Platelets: 213 10*3/uL (ref 150–400)
RBC: 4.51 MIL/uL (ref 3.87–5.11)
RDW: 16.6 % — ABNORMAL HIGH (ref 11.5–15.5)
WBC: 5.1 10*3/uL (ref 4.0–10.5)

## 2017-10-27 LAB — SALICYLATE LEVEL

## 2017-10-27 LAB — ETHANOL: Alcohol, Ethyl (B): <10 mg/dL (ref <10)

## 2017-10-27 LAB — I-STAT BETA HCG BLOOD, ED (MC, WL, AP ONLY): I-stat hCG, quantitative: <5 m[IU]/mL (ref <5)

## 2017-10-27 LAB — ACETAMINOPHEN LEVEL
ACETAMINOPHEN (TYLENOL), SERUM: 34 ug/mL — AB (ref 10–30)
ACETAMINOPHEN (TYLENOL), SERUM: 24 ug/mL (ref 10–30)

## 2017-10-27 MED ORDER — IBUPROFEN 200 MG PO TABS: 400.0000 mg | ORAL_TABLET | Freq: Four times a day (QID) | ORAL | Status: DC | PRN

## 2017-10-27 MED ORDER — LORAZEPAM 1 MG PO TABS: 1.0000 mg | ORAL_TABLET | ORAL | Status: DC | PRN

## 2017-10-27 NOTE — Progress Notes (Signed)
22 yo female who got upset about her taxes and spontaneously took some cold medicine.  Regrets doing this and came to the ED for medical clearance.  Denies suicidal/homicidal ideations, hallucinations, and substance abuse issues.  Her mother is here and permission obtained to talk to her separately.  She has no safety concerns and will have her daughter stay with her and put all meds away for preventive measures.  Nanine MeansJamison Lord, PMHNP

## 2017-10-27 NOTE — ED Notes (Signed)
With triage pt verbalizes "taking a box of medicine" unknown what at 0630 in attempt to harm self. Fall following event. Pt alert and oriented x4 but drowsy. Mother at bedside.
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
## 2017-10-27 NOTE — ED Provider Notes (Signed)
[name]-EMERGENCY DEPT Provider Note   CSN: [id number] Arrival date & time: 10/27/17  4098     History   Chief Complaint Chief Complaint  Patient presents with  . Suicidal  . Ingestion  . Head Injury    HPI Melinda Powell is a 22 y.o. female hx of gestational HTN, anemia, here presenting with possible ingestion, suicidal ideation.  Patient was upset this morning because she found out that she did not get intact returned this year.  She decided to take some NyQuil around 6:30 AM.  Mother showed me the bottle and there are 8 pills in there that contains Tylenol, dextromethorphan, diphenhydramine.  Patient apparently walk to mother's bedroom and fell and hit her head and was noted to have a laceration.  Mother initially brought her in for the laceration and then she told the staff that she had attempted to take those medicines to kill herself.  Mother states that patient does not have previous history of suicidal attempts and no history of depression or schizophrenia.  Patient is drowsy currently and refuses to answer questions.   The history is provided by the patient and a relative.   Level V caveat- AMS, depression, overdose   Past Medical History:  Diagnosis Date  . Anemia   . Gestational diabetes    glyburide  . Hx of varicella   . Hypertension   . Miscarriage     Patient Active Problem List   Diagnosis Date Noted  . Gestational diabetes mellitus treated with oral hypoglycemic therapy 09/07/2015    Past Surgical History:  Procedure Laterality Date  . NO PAST SURGERIES      OB History    Gravida Para Term Preterm AB Living   4 1 1   2 1    SAB TAB Ectopic Multiple Live Births   1 1   0 1       Home Medications    Prior to Admission medications   Not on File    Family History Family History  Problem Relation Age of Onset  . Diabetes Mother   . Diabetes Father     Social History Social History   Tobacco Use  . Smoking  status: Former Smoker    Types: Cigarettes    Last attempt to quit: 01/23/2015    Years since quitting: 2.7  . Smokeless tobacco: Never Used  Substance Use Topics  . Alcohol use: No  . Drug use: No     Allergies   Patient has no known allergies.   Review of Systems Review of Systems  Skin: Positive for wound.  Psychiatric/Behavioral: Positive for suicidal ideas.  All other systems reviewed and are negative.    Physical Exam Updated Vital Signs BP (!) 125/93 (BP Location: Left Arm)   Pulse 65   Temp 97.9 F (36.6 C) (Oral)   Resp 20   SpO2 100%   Physical Exam  Constitutional:  Tired, sleepy, moving all extremities   HENT:  Head: Normocephalic.  Mouth/Throat: Oropharynx is clear and moist.  Eyes: EOM are normal. Pupils are equal, round, and reactive to light.  1 cm laceration above L eyelid   Neck: Normal range of motion. Neck supple.  Cardiovascular: Normal rate, regular rhythm and normal heart sounds.  Pulmonary/Chest: Effort normal and breath sounds normal. No stridor. No respiratory distress. She has no wheezes.  Abdominal: Soft. Bowel sounds are normal. She exhibits no distension. There is no tenderness. There is no guarding.  Musculoskeletal: Normal  range of motion.  Neurological:  Sleepy, moving all extremities   Skin: Skin is dry.  1 cm laceration above L eyelid, below eyebrow   Psychiatric:  Unable   Nursing note and vitals reviewed.    ED Treatments / Results  Labs (all labs ordered are listed, but only abnormal results are displayed) Labs Reviewed  COMPREHENSIVE METABOLIC PANEL - Abnormal; Notable for the following components:      Result Value   CO2 20 (*)    Glucose, Bld 100 (*)    ALT 12 (*)    All other components within normal limits  ACETAMINOPHEN LEVEL - Abnormal; Notable for the following components:   Acetaminophen (Tylenol), Serum 34 (*)    All other components within normal limits  CBC - Abnormal; Notable for the following  components:   Hemoglobin 11.6 (*)    MCH 25.7 (*)    RDW 16.6 (*)    All other components within normal limits  RAPID URINE DRUG SCREEN, HOSP PERFORMED - Abnormal; Notable for the following components:   Tetrahydrocannabinol POSITIVE (*)    All other components within normal limits  ETHANOL  SALICYLATE LEVEL  ACETAMINOPHEN LEVEL  CBG MONITORING, ED  I-STAT BETA HCG BLOOD, ED ([facility], [facility], [facility] ONLY)    EKG  EKG Interpretation  Date/Time:  Friday October 27 2017 07:48:25 EST Ventricular Rate:  74 PR Interval:    QRS Duration: 90 QT Interval:  415 QTC Calculation: 461 R Axis:   77 Text Interpretation:  Sinus rhythm Borderline prolonged PR interval Baseline wander in lead(s) I aVL No significant change since last tracing Confirmed by Richardean CanalYao, David H [phone]([id number]) on 10/27/2017 8:17:55 AM       Radiology Ct Head Wo Contrast  Result Date: 10/27/2017 CLINICAL DATA:  Per EMS pt unwitnessed fall resulting in laceration above left eye. EXAM: CT HEAD WITHOUT CONTRAST TECHNIQUE: Contiguous axial images were obtained from the base of the skull through the vertex without intravenous contrast. COMPARISON:  None. FINDINGS: Brain: No evidence of acute infarction, hemorrhage, hydrocephalus, extra-axial collection or mass lesion/mass effect. Vascular: No hyperdense vessel or unexpected calcification. Skull: No osseous abnormality. Sinuses/Orbits: Visualized paranasal sinuses are clear. Visualized mastoid sinuses are clear. Visualized orbits demonstrate no focal abnormality. Other: None IMPRESSION: No acute intracranial pathology. Electronically Signed   By: Elige KoHetal  Patel   On: 10/27/2017 09:21    Procedures Procedures (including critical care time)  LACERATION REPAIR Performed by: Richardean Canalavid H Yao Authorized by: Richardean Canalavid H Yao Consent: Verbal consent obtained. Risks and benefits: risks, benefits and alternatives were discussed Consent given by: patient Patient identity confirmed: provided demographic data Prepped  and Draped in normal sterile fashion Wound explored  Laceration Location: L eyelid  Laceration Length: 1 cm  No Foreign Bodies seen or palpated  Anesthesia: none  Irrigation method: syringe Amount of cleaning: standard  Skin closure: dermabond  Patient tolerance: Patient tolerated the procedure well with no immediate complications.   Medications Ordered in ED Medications  LORazepam (ATIVAN) tablet 1 mg (not administered)  ibuprofen (ADVIL,MOTRIN) tablet 400 mg (not administered)     Initial Impression / Assessment and Plan / ED Course  I have reviewed the triage vital signs and the nursing notes.  Pertinent labs & imaging results that were available during my care of the patient were reviewed by me and considered in my medical decision making (see chart for details).     Sharion BalloonChristina Davitt is a 22 y.o. female here with L eyelid laceration, fall, possible  overdose. Poison control contacted and recommend 4 hr tylenol level, observation for about 4-6 hrs. I dermabond laceration. Will get psych clearance labs, CT head. Will update tdap.   12:35 PM CT head unremarkable. Initial tylenol 35 and 4 hr level down to 24. LFTs nl. Medically cleared for psych eval.    Final Clinical Impressions(s) / ED Diagnoses   Final diagnoses:  None    ED Discharge Orders    None       Charlynne Pander, MD 10/27/17 1235

## 2017-10-27 NOTE — ED Notes (Signed)
Bed: WA15 Expected date:  Expected time:  Means of arrival:  Comments: Hold for RES B 

## 2017-10-27 NOTE — BH Assessment (Signed)
[facility] Assessment Progress Note  Per Nanine MeansJamison Lord, DNP, this pt does not require psychiatric hospitalization at this time.  [facility] with referral information for area outpatient behavioral health providers.  Discharge instructions include referrals for [facility], and [facility].  Pt's nurse, Aram BeechamCynthia, has been notified.  Doylene Canninghomas Brevan Luberto, MA Triage Specialist [phone]

## 2017-10-27 NOTE — ED Notes (Signed)
Spoke with [facility] from [employer] [employer] who recommended supportive care and the following:  4 hour Tylenol level post ingestion Blood work including liver enzymes  EKG  4-6 hour observation

## 2017-10-27 NOTE — [facility] Suicide Risk Assessment (Signed)
Suicide Risk Assessment  Discharge Assessment   [facility]   Principal Problem: Adjustment disorder with mixed disturbance of emotions and conduct Discharge Diagnoses:  Patient Active Problem List   Diagnosis Date Noted  . Adjustment disorder with mixed disturbance of emotions and conduct [F43.25] 10/27/2017    Priority: High  . Gestational diabetes mellitus treated with oral hypoglycemic therapy [O24.415] 09/07/2015    Total Time spent with patient: 45 minutes  Musculoskeletal: Strength & Muscle Tone: within normal limits Gait & Station: normal Patient leans: N/A  Psychiatric Specialty Exam:   Blood pressure (!) 125/93, pulse 65, temperature 97.9 F (36.6 C), temperature source Oral, resp. rate 20, SpO2 100 %, unknown if currently breastfeeding.There is no height or weight on file to calculate BMI.  General Appearance: Casual  Eye Contact::  Good  Speech:  Normal Rate409  Volume:  Normal  Mood:  Euthymic  Affect:  Congruent  Thought Process:  Coherent and Descriptions of Associations: Intact  Orientation:  Full (Time, Place, and Person)  Thought Content:  WDL and Logical  Suicidal Thoughts:  No  Homicidal Thoughts:  No  Memory:  Immediate;   Good Recent;   Good Remote;   Good  Judgement:  Fair  Insight:  Fair  Psychomotor Activity:  Normal  Concentration:  Good  Recall:  Good  Fund of Knowledge:Good  Language: Good  Akathisia:  No  Handed:  Right  AIMS (if indicated):     Assets:  Communication Skills Desire for Improvement Housing Leisure Time Physical Health Resilience Social Support  Sleep:     Cognition: WNL  ADL's:  Intact   Mental Status Per Nursing Assessment::   On Admission:   22 yo female who got upset about her taxes and spontaneously took some cold medicine.  Regrets doing this and came to the ED for medical clearance.  Denies suicidal/homicidal ideations, hallucinations, and substance abuse issues.  Her mother is  here and permission obtained to talk to her separately.  She has no safety concerns and will have her daughter stay with her and put all meds away for preventive measures.  Stable for discharge.  Demographic Factors:  NA  Loss Factors: NA  Historical Factors: NA  Risk Reduction Factors:   Sense of responsibility to family, Living with another person, especially a relative and Positive social support  Continued Clinical Symptoms:  None  Cognitive Features That Contribute To Risk:  None    Suicide Risk:  Minimal: No identifiable suicidal ideation.  Patients presenting with no risk factors but with morbid ruminations; may be classified as minimal risk based on the severity of the depressive symptoms    Plan Of Care/Follow-up recommendations:  Activity:  as tolerated Diet:  heart healhty diet  [place] Latulippe, NP 10/27/2017, 4:00 PM

## 2017-10-27 NOTE — ED Notes (Signed)
Bed: WA27 Expected date:  Expected time:  Means of arrival:  Comments: Hold for room 15

## 2017-10-27 NOTE — Discharge Instructions (Signed)
For your behavioral health needs, you are advised to follow up with one of the following providers.  Contact them at your earliest opportunity to ask about scheduling an intake appointment:       [facility]      [address]      [place], [place] [address]      [phone]       [facility]      [address]      [place], [place] [address]      [phone]

## 2017-10-27 NOTE — ED Triage Notes (Signed)
Per EMS pt unwitnessed fall resulting in laceration above left eye.

## 2017-12-14 ENCOUNTER — Encounter (HOSPITAL_COMMUNITY): Payer: Self-pay | Admitting: Emergency Medicine

## 2017-12-14 ENCOUNTER — Other Ambulatory Visit: Payer: Self-pay

## 2017-12-14 ENCOUNTER — Ambulatory Visit (HOSPITAL_COMMUNITY)
Admission: EM | Admit: 2017-12-14 | Discharge: 2017-12-14 | Disposition: A | Discharge status: Home / Self Care | Payer: 59 | Attending: Internal Medicine | Admitting: Internal Medicine

## 2017-12-14 DIAGNOSIS — H6502 Acute serous otitis media, left ear: Secondary | ICD-10-CM | POA: Diagnosis not present

## 2017-12-14 DIAGNOSIS — H6983 Other specified disorders of Eustachian tube, bilateral: Secondary | ICD-10-CM

## 2017-12-14 MED ORDER — AMOXICILLIN 875 MG PO TABS: 875.0000 mg | ORAL_TABLET | Freq: Two times a day (BID) | ORAL | 0 refills | Status: AC

## 2017-12-14 MED ORDER — FLUTICASONE PROPIONATE 50 MCG/ACT NA SUSP: 1.0000 | Freq: Every day | NASAL | 2 refills | Status: DC

## 2017-12-14 NOTE — ED Triage Notes (Signed)
C/o bilateral ear "stuffy" onset over 2 weeks

## 2017-12-14 NOTE — Discharge Instructions (Signed)
Push fluids to ensure adequate hydration and keep secretions thin.  Tylenol and/or ibuprofen as needed for pain or fevers.  Complete course of antibiotics.  Daily flonase. May try a daily antihistamine as well. If symptoms worsen or do not improve in the next week to return to be seen or to follow up with your PCP.

## 2017-12-14 NOTE — ED Provider Notes (Signed)
[facility]    CSN: [id number] Arrival date & time: 12/14/17  1127     History   Chief Complaint Chief Complaint  Patient presents with  . Ear Problem    HPI Melinda Powell is a 22 y.o. female.   Melinda Powell presents with complaints of bilateral ear full sensation with decreased hearing. Has been ongoing for two weeks. Has been using hydrogen peroxide and oil as well as qtips which have not been helping. Denies URI symptoms. Denies significant pain. No known fevers. Without contributing medical history.    ROS per HPI.      Past Medical History:  Diagnosis Date  . Anemia   . Gestational diabetes    glyburide  . Hx of varicella   . Hypertension   . Miscarriage     Patient Active Problem List   Diagnosis Date Noted  . 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 Gestational diabetes mellitus treated with oral hypoglycemic therapy 09/07/2015    Past Surgical History:  Procedure Laterality Date  . NO PAST SURGERIES      OB History    Gravida  4   Para  1   Term  1   Preterm      AB  2   Living  1     SAB  1   TAB  1   Ectopic      Multiple  0   Live Births  1            Home Medications    Prior to Admission medications   Medication Sig Start Date End Date Taking? Authorizing Provider  amoxicillin (AMOXIL) 875 MG tablet Take 1 tablet (875 mg total) by mouth 2 (two) times daily for 5 days. 12/14/17 12/19/17  Georgetta HaberBurky, Natalie B, NP  fluticasone (FLONASE) 50 MCG/ACT nasal spray Place 1 spray into both nostrils daily. 12/14/17   Georgetta HaberBurky, Natalie B, NP    Family History Family History  Problem Relation Age of Onset  . Diabetes Mother   . Diabetes Father     Social History Social History   Tobacco Use  . Smoking status: Former Smoker    Packs/day: 0.25    Types: Cigarettes    Last attempt to quit: 01/23/2015    Years since quitting: 2.8  . Smokeless tobacco: Never Used  Substance  Use Topics  . Alcohol use: No  . Drug use: Yes    Types: Marijuana     Allergies   Patient has no known allergies.   Review of Systems Review of Systems   Physical Exam Triage Vital Signs ED Triage Vitals  Enc Vitals Group     BP 12/14/17 1219 124/71     Pulse Rate 12/14/17 1219 90     Resp --      Temp 12/14/17 1219 98.8 F (37.1 C)     Temp Source 12/14/17 1219 Oral     SpO2 12/14/17 1219 100 %     Weight --      Height --      Head Circumference --      Peak Flow --      Pain Score 12/14/17 1218 0     Pain Loc --      Pain Edu? --      Excl. in GC? --    No data found.  Updated Vital Signs BP 124/71 (BP Location: Right Arm)   Pulse 90   Temp 98.8  F (37.1 C) (Oral)   LMP 11/18/2017   SpO2 100%   Visual Acuity Right Eye Distance:   Left Eye Distance:   Bilateral Distance:    Right Eye Near:   Left Eye Near:    Bilateral Near:     Physical Exam  Constitutional: She is oriented to person, place, and time. She appears well-developed and well-nourished. No distress.  HENT:  Right Ear: Ear canal normal. Tympanic membrane is erythematous. A middle ear effusion is present.  Left Ear: Tympanic membrane is erythematous and bulging. A middle ear effusion is present.  Eyes: Pupils are equal, round, and reactive to light. Conjunctivae and EOM are normal.  Cardiovascular: Normal rate, regular rhythm and normal heart sounds.  Pulmonary/Chest: Effort normal and breath sounds normal.  Neurological: She is alert and oriented to person, place, and time.  Skin: Skin is warm and dry.     UC Treatments / Results  Labs (all labs ordered are listed, but only abnormal results are displayed) Labs Reviewed - No data to display  EKG None Radiology No results found.  Procedures Procedures (including critical care time)  Medications Ordered in UC Medications - No data to display   Initial Impression / Assessment and Plan / UC Course  I have reviewed the  triage vital signs and the nursing notes.  Pertinent labs & imaging results that were available during my care of the patient were reviewed by me and considered in my medical decision making (see chart for details).     Without wax to bilateral canals but effusions present. Left TM with redness as well. Without pain or fevers, but worsening of symptoms for two weeks will cover with amoxicillin. Daily flonase recommended. If symptoms worsen or do not improve in the next week to return to be seen or to follow up with PCP.  Patient verbalized understanding and agreeable to plan.    Final Clinical Impressions(s) / UC Diagnoses   Final diagnoses:  Dysfunction of both eustachian tubes  Acute serous otitis media of left ear, recurrence not specified    ED Discharge Orders        Ordered    amoxicillin (AMOXIL) 875 MG tablet  2 times daily     12/14/17 1311    fluticasone (FLONASE) 50 MCG/ACT nasal spray  Daily     12/14/17 1311       Controlled Substance Prescriptions [place] Controlled Substance Registry consulted? Not Applicable   Georgetta Haber, NP 12/14/17 1316

## 2018-01-18 ENCOUNTER — Encounter (HOSPITAL_COMMUNITY): Payer: Self-pay | Admitting: Emergency Medicine

## 2018-01-18 ENCOUNTER — Emergency Department (HOSPITAL_COMMUNITY)
Admission: EM | Admit: 2018-01-18 | Discharge: 2018-01-18 | Disposition: A | Discharge status: Home / Self Care | Payer: 59 | Attending: Emergency Medicine | Admitting: Emergency Medicine

## 2018-01-18 DIAGNOSIS — I10 Essential (primary) hypertension: Secondary | ICD-10-CM | POA: Insufficient documentation

## 2018-01-18 DIAGNOSIS — Z79899 Other long term (current) drug therapy: Secondary | ICD-10-CM | POA: Diagnosis not present

## 2018-01-18 DIAGNOSIS — R102 Pelvic and perineal pain: Secondary | ICD-10-CM | POA: Diagnosis present

## 2018-01-18 DIAGNOSIS — F17210 Nicotine dependence, cigarettes, uncomplicated: Secondary | ICD-10-CM | POA: Diagnosis not present

## 2018-01-18 DIAGNOSIS — N730 Acute parametritis and pelvic cellulitis: Secondary | ICD-10-CM

## 2018-01-18 DIAGNOSIS — A599 Trichomoniasis, unspecified: Secondary | ICD-10-CM | POA: Diagnosis not present

## 2018-01-18 DIAGNOSIS — N739 Female pelvic inflammatory disease, unspecified: Secondary | ICD-10-CM | POA: Insufficient documentation

## 2018-01-18 LAB — CBC
HEMATOCRIT: 31.5 % — AB (ref 36.0–46.0)
Hemoglobin: 9.7 g/dL — ABNORMAL LOW (ref 12.0–15.0)
MCH: 24.2 pg — AB (ref 26.0–34.0)
MCHC: 30.8 g/dL (ref 30.0–36.0)
MCV: 78.6 fL (ref 78.0–100.0)
Platelets: 334 10*3/uL (ref 150–400)
RBC: 4.01 MIL/uL (ref 3.87–5.11)
RDW: 15.2 % (ref 11.5–15.5)
WBC: 4.2 10*3/uL (ref 4.0–10.5)

## 2018-01-18 LAB — LIPASE, BLOOD: LIPASE: 26 U/L (ref 11–51)

## 2018-01-18 LAB — URINALYSIS, ROUTINE W REFLEX MICROSCOPIC
BILIRUBIN URINE: NEGATIVE
Glucose, UA: NEGATIVE mg/dL
Ketones, ur: NEGATIVE mg/dL
Nitrite: NEGATIVE
Protein, ur: NEGATIVE mg/dL
SPECIFIC GRAVITY, URINE: 1.017 (ref 1.005–1.030)
pH: 7 (ref 5.0–8.0)

## 2018-01-18 LAB — WET PREP, GENITAL
Clue Cells Wet Prep HPF POC: NONE SEEN
Sperm: NONE SEEN
Yeast Wet Prep HPF POC: NONE SEEN

## 2018-01-18 LAB — COMPREHENSIVE METABOLIC PANEL
ALBUMIN: 3.8 g/dL (ref 3.5–5.0)
ALT: 10 U/L — ABNORMAL LOW (ref 14–54)
AST: 12 U/L — AB (ref 15–41)
Alkaline Phosphatase: 41 U/L (ref 38–126)
Anion gap: 8 (ref 5–15)
BUN: 15 mg/dL (ref 6–20)
CHLORIDE: 110 mmol/L (ref 101–111)
CO2: 25 mmol/L (ref 22–32)
Calcium: 9 mg/dL (ref 8.9–10.3)
Creatinine, Ser: 0.69 mg/dL (ref 0.44–1.00)
GFR calc Af Amer: >60 mL/min (ref >60)
GFR calc non Af Amer: >60 mL/min (ref >60)
GLUCOSE: 79 mg/dL (ref 65–99)
POTASSIUM: 4.2 mmol/L (ref 3.5–5.1)
Sodium: 143 mmol/L (ref 135–145)
Total Bilirubin: 0.3 mg/dL (ref 0.3–1.2)
Total Protein: 6.9 g/dL (ref 6.5–8.1)

## 2018-01-18 LAB — I-STAT BETA HCG BLOOD, ED (MC, WL, AP ONLY): I-stat hCG, quantitative: <5 m[IU]/mL (ref <5)

## 2018-01-18 LAB — POC URINE PREG, ED: Preg Test, Ur: NEGATIVE

## 2018-01-18 MED ORDER — CEFTRIAXONE SODIUM 250 MG IJ SOLR
250.0000 mg | Freq: Once | INTRAMUSCULAR | Status: AC
  Administered 2018-01-18: 250 mg via INTRAMUSCULAR
  Filled 2018-01-18: qty 250

## 2018-01-18 MED ORDER — DOXYCYCLINE HYCLATE 100 MG PO CAPS: 100.0000 mg | ORAL_CAPSULE | Freq: Two times a day (BID) | ORAL | 0 refills | Status: AC

## 2018-01-18 MED ORDER — DOXYCYCLINE HYCLATE 100 MG PO TABS
100.0000 mg | ORAL_TABLET | Freq: Once | ORAL | Status: AC
  Administered 2018-01-18: 100 mg via ORAL
  Filled 2018-01-18: qty 1

## 2018-01-18 MED ORDER — LIDOCAINE HCL 1 % IJ SOLN
INTRAMUSCULAR | Status: AC
  Administered 2018-01-18: 1.5 mL
  Filled 2018-01-18: qty 20

## 2018-01-18 MED ORDER — METRONIDAZOLE 500 MG PO TABS
500.0000 mg | ORAL_TABLET | Freq: Once | ORAL | Status: AC
  Administered 2018-01-18: 500 mg via ORAL
  Filled 2018-01-18: qty 1

## 2018-01-18 MED ORDER — METRONIDAZOLE 500 MG PO TABS: 500.0000 mg | ORAL_TABLET | Freq: Two times a day (BID) | ORAL | 0 refills | Status: AC

## 2018-01-18 NOTE — Discharge Instructions (Addendum)
Your seen here today and diagnosed with pelvic inflammatory disease. Your lab results are positive for trichomonas Please see attached handouts on these. Please take doxycycline and Flagyl as prescribed to completion. Please take all of your antibiotics until finished!   You may develop abdominal discomfort or diarrhea from the antibiotic.  You may help offset this with probiotics which you can buy or get in yogurt. Do not eat or take the probiotics until 2 hours after your antibiotic. Do not take your medicine if develop an itchy rash, swelling in your mouth or lips, or difficulty breathing.  Please note that doxycycline can cause sensitivity to light.  I recommend wearing sunscreen when you are outside. Please do not drink alcohol while taking metronidazole/Flagyl.  You will become very ill with abdominal pain, nausea and vomiting as well as other symptoms if you do.   Please follow up with your OBGYN or PCP this week for further evaluation.   If you develop worsening or new concerning symptoms you can return to the emergency department for re-evaluation.

## 2018-01-18 NOTE — ED Provider Notes (Signed)
[facility]-EMERGENCY DEPT Provider Note   CSN: [id number] Arrival date & time: 01/18/18  1054     History   Chief Complaint Chief Complaint  Patient presents with  . Pelvic Pain  . Vaginal Discharge    HPI Melinda Powell is a 22 y.o. female with a history of hypertension who presents emergency department today for pelvic pain and vaginal discharge.  Patient states that she was in [place] a few months ago when she went to the emergency department for vaginal discharge as well as abdominal cramping and states she tested positive for chlamydia and received an injection as well as 2 pills for tx.  She states that since that time she has not been sexually active but has been having recurrent lower abdominal pain, pelvic pain, as well as copious white vaginal discharge.  She notes she has some increasing urinary frequency but without dysuria, hematuria, urgency, flank pain, fever, or vomiting.  She does have occasional nausea.  She notes that on occasion she does see a small amount of blood when she wipes after she urinates.  She denies any gross hematuria or blood clots.  The patient reports that she is tried over-the-counter medication for symptoms without relief.  She notes nothing makes her symptoms better or worse.  Patient denies any focalized abdominal pain, constipation, diarrhea, hematochezia, melena, or previous abdominal surgeries.   HPI  Past Medical History:  Diagnosis Date  . Anemia   . Gestational diabetes    glyburide  . Hx of varicella   . Hypertension   . Miscarriage     Patient Active Problem List   Diagnosis Date Noted  . 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 Gestational diabetes mellitus treated with oral hypoglycemic therapy 09/07/2015    Past Surgical History:  Procedure Laterality Date  . NO PAST SURGERIES       OB History    Gravida  4   Para  1   Term  1   Preterm      AB  2     Living  1     SAB  1   TAB  1   Ectopic      Multiple  0   Live Births  1            Home Medications    Prior to Admission medications   Medication Sig Start Date End Date Taking? Authorizing Provider  fluticasone (FLONASE) 50 MCG/ACT nasal spray Place 1 spray into both nostrils daily. 12/14/17   Georgetta Haber, NP    Family History Family History  Problem Relation Age of Onset  . Diabetes Mother   . Diabetes Father     Social History Social History   Tobacco Use  . Smoking status: Current Every Day Smoker    Packs/day: 0.25    Types: Cigarettes  . Smokeless tobacco: Never Used  Substance Use Topics  . Alcohol use: Yes    Comment: occ  . Drug use: Yes    Types: Marijuana     Allergies   Patient has no known allergies.   Review of Systems Review of Systems  All other systems reviewed and are negative.    Physical Exam Updated Vital Signs BP (!) 145/80 (BP Location: Left Arm)   Pulse 70   Temp 99.5 F (37.5 C) (Oral)   Resp 17   Ht  (1.676 m)   Wt 81.6 kg (180  lb)   LMP 12/19/2017 (Exact Date)   SpO2 100%   Breastfeeding? No   BMI 29.05 kg/m   Physical Exam  Constitutional: She appears well-developed and well-nourished.  HENT:  Head: Normocephalic and atraumatic.  Right Ear: External ear normal.  Left Ear: External ear normal.  Nose: Nose normal.  Mouth/Throat: Uvula is midline, oropharynx is clear and moist and mucous membranes are normal. No tonsillar exudate.  Eyes: Pupils are equal, round, and reactive to light. Right eye exhibits no discharge. Left eye exhibits no discharge. No scleral icterus.  Neck: Trachea normal. Neck supple. No spinous process tenderness present. No neck rigidity. Normal range of motion present.  Cardiovascular: Normal rate, regular rhythm and intact distal pulses.  No murmur heard. Pulses:      Radial pulses are 2+ on the right side, and 2+ on the left side.       Dorsalis pedis pulses are 2+  on the right side, and 2+ on the left side.       Posterior tibial pulses are 2+ on the right side, and 2+ on the left side.  No lower extremity swelling or edema. Calves symmetric in size bilaterally.  Pulmonary/Chest: Effort normal and breath sounds normal. She exhibits no tenderness.  Abdominal: Soft. Bowel sounds are normal. There is tenderness in the suprapubic area. There is no rigidity, no rebound, no guarding and no CVA tenderness.  Genitourinary:  Genitourinary Comments: Exam performed by Jacinto Halim, exam chaperoned Pelvic exam: normal external genitalia without evidence of trauma. VULVA: normal appearing vulva with no masses, tenderness or lesion. VAGINA: normal appearing vagina with normal color and discharge, no lesions. CERVIX: normal appearing cervix without lesions, cervical motion tenderness present, cervical os closed with out purulent discharge; vaginal discharge - white and creamy, Wet prep and DNA probe for chlamydia and GC obtained.   ADNEXA: normal adnexa in size, nontender and no masses UTERUS: uterus is normal size, shape, consistency and nontender.   Musculoskeletal: She exhibits no edema.  Lymphadenopathy:    She has no cervical adenopathy.  Neurological: She is alert.  Skin: Skin is warm and dry. No rash noted. She is not diaphoretic.  Psychiatric: She has a normal mood and affect.  Nursing note and vitals reviewed.    ED Treatments / Results  Labs (all labs ordered are listed, but only abnormal results are displayed) Labs Reviewed  WET PREP, GENITAL - Abnormal; Notable for the following components:      Result Value   Trich, Wet Prep PRESENT (*)    WBC, Wet Prep HPF POC MANY (*)    All other components within normal limits  COMPREHENSIVE METABOLIC PANEL - Abnormal; Notable for the following components:   AST 12 (*)    ALT 10 (*)    All other components within normal limits  CBC - Abnormal; Notable for the following components:   Hemoglobin 9.7  (*)    HCT 31.5 (*)    MCH 24.2 (*)    All other components within normal limits  URINALYSIS, ROUTINE W REFLEX MICROSCOPIC - Abnormal; Notable for the following components:   Hgb urine dipstick LARGE (*)    Leukocytes, UA TRACE (*)    Bacteria, UA RARE (*)    All other components within normal limits  LIPASE, BLOOD  I-STAT BETA HCG BLOOD, ED ([facility], [facility], [facility] ONLY)  POC URINE PREG, ED  GC/CHLAMYDIA PROBE AMP ([facility]) NOT AT [facility]    EKG None  Radiology No results  found.  Procedures Procedures (including critical care time)  Medications Ordered in ED Medications  cefTRIAXone (ROCEPHIN) injection 250 mg (has no administration in time range)  doxycycline (VIBRA-TABS) tablet 100 mg (has no administration in time range)  metroNIDAZOLE (FLAGYL) tablet 500 mg (has no administration in time range)     Initial Impression / Assessment and Plan / ED Course  I have reviewed the triage vital signs and the nursing notes.  Pertinent labs & imaging results that were available during my care of the patient were reviewed by me and considered in my medical decision making (see chart for details).     22 year old female who recently tested positive for chlamydia while in [place] states she has been having ongoing pelvic pain, lower abdominal pain, nausea, pelvic pain as well as copious white vaginal discharge.  Patient vital signs are reassuring on presentation.  Patient with mild suprapubic tenderness palpation without peritoneal signs.  No CVA tenderness.  Patient's pelvic exam with copious white discharge as well as cervical motion tenderness.  Patient's labs significant for trichomonas.  There is concern for PID given patient is now tested positive for chlamydia and trichomonas and has cervical motion tenderness.  Patient given IM Rocephin in the department as well as first dose of doxycycline and metronidazole.  All other labs reviewed.  Will treat the patient with 2 weeks of doxycycline 100  mg p.o. twice daily as well as metronidazole 500 mg p.o. twice daily.  Discussed with patient that doxycycline can cause sensitivity light.  Also discussed that metronidazole/Flagyl should not be used in commendation with alcohol as it can make her feel very sick. I advised the patient to follow-up with PCP/OBGYN this week. Specific return precautions discussed. Time was given for all questions to be answered. The patient verbalized understanding and agreement with plan. The patient appears safe for discharge home.  Final Clinical Impressions(s) / ED Diagnoses   Final diagnoses:  PID (acute pelvic inflammatory disease)  Trichomonas infection    ED Discharge Orders        Ordered    metroNIDAZOLE (FLAGYL) 500 MG tablet  2 times daily with meals     01/18/18 1721    doxycycline (VIBRAMYCIN) 100 MG capsule  2 times daily     01/18/18 1721       Princella Pellegrini 01/18/18 1722    Nira Conn, MD 01/19/18 2241

## 2018-01-18 NOTE — ED Triage Notes (Signed)
Pt reports recurrent abdominal pain 3 months after treatment for STD. C/o while vaginal discharge and vaginal bleeding  C/o intermittent nausea. Ate full breakfast this morning. Pt is concerned that symptoms of GC have returned.  Pt has infant and toddler with her, no other adult.

## 2018-01-19 LAB — GC/CHLAMYDIA PROBE AMP (~~LOC~~) NOT AT ARMC
Chlamydia: POSITIVE — AB
Neisseria Gonorrhea: NEGATIVE

## 2018-03-23 ENCOUNTER — Emergency Department (HOSPITAL_COMMUNITY)
Admission: EM | Admit: 2018-03-23 | Discharge: 2018-03-24 | Disposition: A | Discharge status: Home / Self Care | Payer: 59 | Attending: Emergency Medicine | Admitting: Emergency Medicine

## 2018-03-23 ENCOUNTER — Other Ambulatory Visit: Payer: Self-pay

## 2018-03-23 DIAGNOSIS — Z79899 Other long term (current) drug therapy: Secondary | ICD-10-CM | POA: Insufficient documentation

## 2018-03-23 DIAGNOSIS — F17210 Nicotine dependence, cigarettes, uncomplicated: Secondary | ICD-10-CM | POA: Insufficient documentation

## 2018-03-23 DIAGNOSIS — N760 Acute vaginitis: Secondary | ICD-10-CM | POA: Insufficient documentation

## 2018-03-23 DIAGNOSIS — B9689 Other specified bacterial agents as the cause of diseases classified elsewhere: Secondary | ICD-10-CM | POA: Insufficient documentation

## 2018-03-23 DIAGNOSIS — N939 Abnormal uterine and vaginal bleeding, unspecified: Secondary | ICD-10-CM | POA: Diagnosis present

## 2018-03-23 DIAGNOSIS — I10 Essential (primary) hypertension: Secondary | ICD-10-CM | POA: Insufficient documentation

## 2018-03-23 DIAGNOSIS — Z711 Person with feared health complaint in whom no diagnosis is made: Secondary | ICD-10-CM

## 2018-03-23 NOTE — ED Provider Notes (Signed)
[facility]-EMERGENCY DEPT Provider Note   CSN: [id number] Arrival date & time: 03/23/18  2027     History   Chief Complaint Chief Complaint  Patient presents with  . Migraine  . Vaginal Bleeding    HPI Melinda Powell is a 22 y.o. female with a hx of anemia, HTN presents to the Emergency Department complaining of numerous symptoms onset 2-3 weeks ago.  Pt reports she was treated for chlamydia in March and was treated with a "shot and pills."  Pt reports return of symptoms in May with positive testing and retreatment which initially cleared them however they have returned in the last 2-3 weeks ago.  Pt reports she feels generalized headache, dizziness, weakness, myalgias, spotting after her menses, chills and lower abd pain. Pt reports these are the exact same symptoms as the last 2 times.  She reports she is not currently sexually active and has not been since March. She reports she was tested for HIV in March, but has not been retested.   Pt reports the headache is well treated with Aleve, but returns after the medication wears off.  Pt denies rash, open wounds, tick bites, fevers.   Pt also denies neck pain, neck stiffness, chest pain, SOB, nausea, vomiting, diarrhea, syncope, dysuria, hematuria.  LMP July 8th, 2019.     The history is provided by the patient and medical records. No language interpreter was used.    Past Medical History:  Diagnosis Date  . Anemia   . Gestational diabetes    glyburide  . Hx of varicella   . Hypertension   . Miscarriage     Patient Active Problem List   Diagnosis Date Noted  . 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 Gestational diabetes mellitus treated with oral hypoglycemic therapy 09/07/2015    Past Surgical History:  Procedure Laterality Date  . NO PAST SURGERIES       OB History    Gravida  4   Para  1   Term  1   Preterm      AB  2   Living  1     SAB    1   TAB  1   Ectopic      Multiple  0   Live Births  1            Home Medications    Prior to Admission medications   Medication Sig Start Date End Date Taking? Authorizing Provider  doxycycline (VIBRAMYCIN) 100 MG capsule Take 1 capsule (100 mg total) by mouth 2 (two) times daily. 03/24/18   Deklyn Gibbon, Dahlia Client, PA-C  fluticasone (FLONASE) 50 MCG/ACT nasal spray Place 1 spray into both nostrils daily. Patient not taking: Reported on 03/24/2018 12/14/17   Linus Mako B, NP  metroNIDAZOLE (FLAGYL) 500 MG tablet Take 1 tablet (500 mg total) by mouth 2 (two) times daily. One po bid x 7 days 03/24/18   Dakotah Orrego, Dahlia Client, PA-C    Family History Family History  Problem Relation Age of Onset  . Diabetes Mother   . Diabetes Father     Social History Social History   Tobacco Use  . Smoking status: Current Every Day Smoker    Packs/day: 0.25    Types: Cigarettes  . Smokeless tobacco: Never Used  Substance Use Topics  . Alcohol use: Yes    Comment: occ  . Drug use: Yes    Types: Marijuana     Allergies  Patient has no known allergies.   Review of Systems Review of Systems  Constitutional: Positive for chills. Negative for appetite change, diaphoresis, fatigue, fever and unexpected weight change.  HENT: Negative for mouth sores.   Eyes: Negative for visual disturbance.  Respiratory: Negative for cough, chest tightness, shortness of breath and wheezing.   Cardiovascular: Negative for chest pain.  Gastrointestinal: Positive for abdominal pain. Negative for constipation, diarrhea, nausea and vomiting.  Endocrine: Negative for polydipsia, polyphagia and polyuria.  Genitourinary: Positive for vaginal bleeding. Negative for dysuria, frequency, hematuria and urgency.  Musculoskeletal: Positive for myalgias. Negative for back pain and neck stiffness.  Skin: Negative for rash.  Allergic/Immunologic: Negative for immunocompromised state.  Neurological: Positive for  dizziness, weakness and headaches. Negative for syncope and light-headedness.  Hematological: Does not bruise/bleed easily.  Psychiatric/Behavioral: Negative for sleep disturbance. The patient is not nervous/anxious.      Physical Exam Updated Vital Signs BP 124/71 (BP Location: Left Arm)   Pulse (!) 102   Temp 99.3 F (37.4 C) (Oral)   Resp 18   Ht 5\' 6"  (1.676 m)   Wt 74.8 kg (165 lb)   LMP 03/12/2018   SpO2 100%   BMI 26.63 kg/m   Physical Exam  Constitutional: She appears well-developed and well-nourished. No distress.  Awake, alert, nontoxic appearance  HENT:  Head: Normocephalic and atraumatic.  Mouth/Throat: Oropharynx is clear and moist. No oropharyngeal exudate.  Eyes: Conjunctivae are normal. No scleral icterus.  Neck: Normal range of motion. Neck supple.  Cardiovascular: Normal rate, regular rhythm, normal heart sounds and intact distal pulses.  No murmur heard. Pulmonary/Chest: Effort normal and breath sounds normal. No respiratory distress. She has no wheezes.  Equal chest expansion  Abdominal: Soft. Bowel sounds are normal. She exhibits no mass. There is no tenderness. There is no rebound and no guarding. Hernia confirmed negative in the right inguinal area and confirmed negative in the left inguinal area.  Genitourinary: Uterus normal. No labial fusion. There is no rash, tenderness or lesion on the right labia. There is no rash, tenderness or lesion on the left labia. Uterus is not deviated, not enlarged, not fixed and not tender. Cervix exhibits no motion tenderness, no discharge and no friability. Right adnexum displays no mass, no tenderness and no fullness. Left adnexum displays no mass, no tenderness and no fullness. No erythema, tenderness or bleeding in the vagina. No foreign body in the vagina. No signs of injury around the vagina. Vaginal discharge (Thick, brown, small amount) found.  Musculoskeletal: Normal range of motion. She exhibits no edema.    Lymphadenopathy:       Right: No inguinal adenopathy present.       Left: No inguinal adenopathy present.  Neurological: She is alert.  Speech is clear and goal oriented Moves extremities without ataxia Ambulates with steady gait.  Skin: Skin is warm and dry. She is not diaphoretic. No erythema.  Psychiatric: She has a normal mood and affect.  Nursing note and vitals reviewed.    ED Treatments / Results  Labs (all labs ordered are listed, but only abnormal results are displayed) Labs Reviewed  WET PREP, GENITAL - Abnormal; Notable for the following components:      Result Value   Clue Cells Wet Prep HPF POC PRESENT (*)    WBC, Wet Prep HPF POC MODERATE (*)    All other components within normal limits  URINALYSIS, ROUTINE W REFLEX MICROSCOPIC - Abnormal; Notable for the following components:   Hgb  urine dipstick MODERATE (*)    All other components within normal limits  RPR  HIV ANTIBODY (ROUTINE TESTING)  POC URINE PREG, ED  GC/CHLAMYDIA PROBE AMP ([place]    Procedures Procedures (including critical care time)  Medications Ordered in ED Medications  cefTRIAXone (ROCEPHIN) injection 250 mg (250 mg Intramuscular Given 03/24/18 0152)  azithromycin (ZITHROMAX) tablet 1,000 mg (1,000 mg Oral Given 03/24/18 0152)  lidocaine (XYLOCAINE) 1 % (with pres) injection (0.9 mLs  Given 03/24/18 0152)     Initial Impression / Assessment and Plan / ED Course  I have reviewed the triage vital signs and the nursing notes.  Pertinent labs & imaging results that were available during my care of the patient were reviewed by me and considered in my medical decision making (see chart for details).  Clinical Course as of Mar 24 206  Sat Mar 24, 2018  0137 Patient tachycardic upon arrival but no tachycardia on my exam.  Pulse Rate(!): 102 [HM]  0137 Preg Test, Ur: NEGATIVE [HM]  0137 Hemoglobin noted in patient's urine however this is likely from her vaginal vault.  No  evidence of urinary tract infection.  Hgb urine dipstickMarland Kitchen(!): MODERATE [HM]    Clinical Course User Index [HM] Addis Tuohy, Boyd KerbsHannah, PA-C     Presents with concerns for STD.  She has a host of complaints that are vague and generalized.  Symptoms have been ongoing 2-3 weeks.  She is well-appearing on exam.  Her vital signs are within normal limits.  She reports symptoms are the same as previous recurrences of chlamydia.  Will give Rocephin and azithromycin here in the emergency department.   Also, patient will be discharged home with doxycycline.  Wet prep shows evidence of BV.  Patient will be discharged with Flagyl as well.  Cultures obtained along with syphilis and HIV as I am concerned that some of these vague symptoms may be early HIV. No evidence of acute PID on exam.  No cervical motion tenderness or adnexal tenderness.  No evidence of urinary tract infection.  [facility], and is afebrile.  No lesions today to suggest syphilis.  Patient denies history of vaginal lesions consistent with syphilis.  RPR sent.  Patient has been referred to OB/GYN to establish care.  Discussed reasons to return immediately to the emergency department including ongoing or worsening symptoms, syncope, focal abdominal pain, vomiting or other concerns.  Patient and her sister state understanding and are in agreement with this plan.   Final Clinical Impressions(s) / ED Diagnoses   Final diagnoses:  Concern about STD in female without diagnosis  Vaginal bleeding  BV (bacterial vaginosis)    ED Discharge Orders        Ordered    doxycycline (VIBRAMYCIN) 100 MG capsule  2 times daily     03/24/18 0141    metroNIDAZOLE (FLAGYL) 500 MG tablet  2 times daily     03/24/18 0207       Margarette Vannatter, Boyd KerbsHannah, PA-C 03/24/18 0208    Palumbo, April, MD 03/24/18 [phone]

## 2018-03-23 NOTE — ED Triage Notes (Signed)
Pt reports irregular  Vaginal bleeding and severe headaches with recent Hx of STD tx.  Also c/o abd pain that is the same as when she was initially dx with STD

## 2018-03-24 DIAGNOSIS — N939 Abnormal uterine and vaginal bleeding, unspecified: Secondary | ICD-10-CM | POA: Diagnosis not present

## 2018-03-24 LAB — URINALYSIS, ROUTINE W REFLEX MICROSCOPIC
Bacteria, UA: NONE SEEN
Bilirubin Urine: NEGATIVE
Glucose, UA: NEGATIVE mg/dL
Ketones, ur: NEGATIVE mg/dL
Leukocytes, UA: NEGATIVE
Nitrite: NEGATIVE
Protein, ur: NEGATIVE mg/dL
Specific Gravity, Urine: 1.02 (ref 1.005–1.030)
pH: 5 (ref 5.0–8.0)

## 2018-03-24 LAB — WET PREP, GENITAL
Sperm: NONE SEEN
Trich, Wet Prep: NONE SEEN
Yeast Wet Prep HPF POC: NONE SEEN

## 2018-03-24 LAB — RPR: RPR Ser Ql: NONREACTIVE

## 2018-03-24 LAB — POC URINE PREG, ED: Preg Test, Ur: NEGATIVE

## 2018-03-24 LAB — HIV ANTIBODY (ROUTINE TESTING W REFLEX): HIV Screen 4th Generation wRfx: NONREACTIVE

## 2018-03-24 MED ORDER — CEFTRIAXONE SODIUM 250 MG IJ SOLR
250.0000 mg | Freq: Once | INTRAMUSCULAR | Status: AC
  Administered 2018-03-24: 250 mg via INTRAMUSCULAR
  Filled 2018-03-24: qty 250

## 2018-03-24 MED ORDER — LIDOCAINE HCL 1 % IJ SOLN
INTRAMUSCULAR | Status: AC
  Administered 2018-03-24: 0.9 mL
  Filled 2018-03-24: qty 20

## 2018-03-24 MED ORDER — DOXYCYCLINE HYCLATE 100 MG PO CAPS: 100.0000 mg | ORAL_CAPSULE | Freq: Two times a day (BID) | ORAL | 0 refills | Status: DC

## 2018-03-24 MED ORDER — AZITHROMYCIN 250 MG PO TABS
1000.0000 mg | ORAL_TABLET | Freq: Once | ORAL | Status: AC
  Administered 2018-03-24: 1000 mg via ORAL
  Filled 2018-03-24: qty 4

## 2018-03-24 MED ORDER — METRONIDAZOLE 500 MG PO TABS: 500.0000 mg | ORAL_TABLET | Freq: Two times a day (BID) | ORAL | 0 refills | Status: DC

## 2018-03-24 NOTE — Discharge Instructions (Signed)
1. Medications: Doxycycline, usual home medications 2. Treatment: rest, drink plenty of fluids, use a condom with every sexual encounter 3. Follow Up: Please followup with your primary doctor in 3 days for discussion of your diagnoses and further evaluation after today's visit; if you do not have a primary care doctor use the resource guide provided to find one; Please return to the ER for worsening symptoms, high fevers or persistent vomiting.  You have been tested for HIV, syphilis, chlamydia and gonorrhea.  These results will be available in approximately 3 days.  Please inform all sexual partners if you test positive for any of these diseases.  

## 2018-03-26 LAB — GC/CHLAMYDIA PROBE AMP (~~LOC~~) NOT AT ARMC
CHLAMYDIA, DNA PROBE: NEGATIVE
NEISSERIA GONORRHEA: NEGATIVE

## 2018-08-12 ENCOUNTER — Encounter (HOSPITAL_COMMUNITY): Payer: Self-pay

## 2018-08-12 ENCOUNTER — Emergency Department (HOSPITAL_COMMUNITY)
Admission: EM | Admit: 2018-08-12 | Discharge: 2018-08-12 | Disposition: A | Discharge status: Home / Self Care | Payer: 59 | Attending: Emergency Medicine | Admitting: Emergency Medicine

## 2018-08-12 DIAGNOSIS — R1030 Lower abdominal pain, unspecified: Secondary | ICD-10-CM | POA: Diagnosis not present

## 2018-08-12 DIAGNOSIS — F17210 Nicotine dependence, cigarettes, uncomplicated: Secondary | ICD-10-CM | POA: Diagnosis not present

## 2018-08-12 DIAGNOSIS — I10 Essential (primary) hypertension: Secondary | ICD-10-CM | POA: Insufficient documentation

## 2018-08-12 DIAGNOSIS — N939 Abnormal uterine and vaginal bleeding, unspecified: Secondary | ICD-10-CM | POA: Insufficient documentation

## 2018-08-12 LAB — URINALYSIS, ROUTINE W REFLEX MICROSCOPIC
Bilirubin Urine: NEGATIVE
GLUCOSE, UA: NEGATIVE mg/dL
Hgb urine dipstick: NEGATIVE
Ketones, ur: NEGATIVE mg/dL
LEUKOCYTES UA: NEGATIVE
Nitrite: NEGATIVE
PROTEIN: NEGATIVE mg/dL
SPECIFIC GRAVITY, URINE: 1.023 (ref 1.005–1.030)
pH: 7 (ref 5.0–8.0)

## 2018-08-12 LAB — CBC WITH DIFFERENTIAL/PLATELET
Abs Immature Granulocytes: 0.01 10*3/uL (ref 0.00–0.07)
BASOS ABS: 0.1 10*3/uL (ref 0.0–0.1)
Basophils Relative: 1 %
EOS ABS: 0.1 10*3/uL (ref 0.0–0.5)
EOS PCT: 2 %
HCT: 30.1 % — ABNORMAL LOW (ref 36.0–46.0)
Hemoglobin: 8.7 g/dL — ABNORMAL LOW (ref 12.0–15.0)
Immature Granulocytes: 0 %
Lymphocytes Relative: 50 %
Lymphs Abs: 2.7 10*3/uL (ref 0.7–4.0)
MCH: 21.1 pg — ABNORMAL LOW (ref 26.0–34.0)
MCHC: 28.9 g/dL — AB (ref 30.0–36.0)
MCV: 73.1 fL — ABNORMAL LOW (ref 80.0–100.0)
Monocytes Absolute: 0.4 10*3/uL (ref 0.1–1.0)
Monocytes Relative: 7 %
NRBC: 0 % (ref 0.0–0.2)
Neutro Abs: 2.1 10*3/uL (ref 1.7–7.7)
Neutrophils Relative %: 40 %
Platelets: 277 10*3/uL (ref 150–400)
RBC: 4.12 MIL/uL (ref 3.87–5.11)
RDW: 18.3 % — AB (ref 11.5–15.5)
WBC: 5.3 10*3/uL (ref 4.0–10.5)

## 2018-08-12 LAB — COMPREHENSIVE METABOLIC PANEL
ALBUMIN: 3.9 g/dL (ref 3.5–5.0)
ALT: 10 U/L (ref 0–44)
ANION GAP: 10 (ref 5–15)
AST: 14 U/L — ABNORMAL LOW (ref 15–41)
Alkaline Phosphatase: 33 U/L — ABNORMAL LOW (ref 38–126)
BILIRUBIN TOTAL: 0.6 mg/dL (ref 0.3–1.2)
BUN: 18 mg/dL (ref 6–20)
CO2: 23 mmol/L (ref 22–32)
Calcium: 9 mg/dL (ref 8.9–10.3)
Chloride: 104 mmol/L (ref 98–111)
Creatinine, Ser: 0.75 mg/dL (ref 0.44–1.00)
GFR calc Af Amer: >60 mL/min (ref >60)
GFR calc non Af Amer: >60 mL/min (ref >60)
GLUCOSE: 88 mg/dL (ref 70–99)
Potassium: 3.9 mmol/L (ref 3.5–5.1)
SODIUM: 137 mmol/L (ref 135–145)
TOTAL PROTEIN: 6.5 g/dL (ref 6.5–8.1)

## 2018-08-12 LAB — WET PREP, GENITAL
Sperm: NONE SEEN
Trich, Wet Prep: NONE SEEN
Yeast Wet Prep HPF POC: NONE SEEN

## 2018-08-12 LAB — I-STAT BETA HCG BLOOD, ED (MC, WL, AP ONLY): I-stat hCG, quantitative: <5 m[IU]/mL (ref <5)

## 2018-08-12 LAB — HCG, QUANTITATIVE, PREGNANCY

## 2018-08-12 MED ORDER — CEFTRIAXONE SODIUM 250 MG IJ SOLR
250.0000 mg | Freq: Once | INTRAMUSCULAR | Status: AC
  Administered 2018-08-12: 250 mg via INTRAMUSCULAR
  Filled 2018-08-12: qty 250

## 2018-08-12 MED ORDER — AZITHROMYCIN 250 MG PO TABS
1000.0000 mg | ORAL_TABLET | Freq: Once | ORAL | Status: AC
  Administered 2018-08-12: 1000 mg via ORAL
  Filled 2018-08-12: qty 4

## 2018-08-12 MED ORDER — STERILE WATER FOR INJECTION IJ SOLN
INTRAMUSCULAR | Status: AC
  Filled 2018-08-12: qty 10

## 2018-08-12 MED ORDER — ACETAMINOPHEN 325 MG PO TABS: 650.0000 mg | ORAL_TABLET | Freq: Once | ORAL | Status: DC

## 2018-08-12 MED ORDER — METRONIDAZOLE 500 MG PO TABS: 500.0000 mg | ORAL_TABLET | Freq: Two times a day (BID) | ORAL | 0 refills | Status: DC

## 2018-08-12 MED ORDER — STERILE WATER FOR INJECTION IJ SOLN
0.9000 mL | Freq: Once | INTRAMUSCULAR | Status: AC
  Administered 2018-08-12: 0.9 mL via INTRAMUSCULAR

## 2018-08-12 NOTE — Discharge Instructions (Addendum)
Please read and follow all provided instructions. Your pregnancy test was negative. Please follow up with your primary care provider or OBGYN.  If you do not have a regular menses please repeat your pregnancy test in 1-2 weeks to ensure this is negative.  If you develop any worsening abdominal pain, feel like you are going to pass out, pass out, develop nausea, vomiting or worsening symptoms please return for further evaluation.  Today you were cultured for a sexually transmitted disease like chlamydia or gonorrhea. You have elected to be treated at this time as a precaution . Results of your gonorrhea and chlamydia tests are pending and you will be notified if they are positive. Please refrain from sexual activity for 48 hours  It is very important to practice safe sex and use condoms when sexually active. If the test is positive, please refrain from sexual activity for 10 days for the medicine to take effect and notify your sexual partners for the last 6 months as they, too, may want to be tested. The website https://garcia.net/http://www.dontspreadit.com/ can be used to send anonymous text messages or emails to alert sexual contacts.  Because you have had unprotected sex, you may want to consider getting tested for HIV (and Syphilis) as well. If you did not elect to be tested for HIV or Syphilis (blood tests) in the department today, you can follow up at the Health Department for further testing for this. Remember that latex condoms or abstinence are the only way to prevent against STDs or HIV.  If you should develop severe or worsening pain in your abdomen or the pelvis or develop severe fevers, nausea or vomiting that prevent you from taking your medications, return to the emergency department immediately. Otherwise contact your local physician or county health department for a follow up appointment to complete STD testing including HIV and syphilis. I have attached the information for the health department.   Gonorrhea  and Chlamydia SYMPTOMS  In females, symptoms may go unnoticed. Symptoms that are more noticeable can include:  Belly (abdominal) pain.  Painful intercourse.  Watery mucous-like discharge from the vagina.  Miscarriage.  Discomfort when urinating.  Inflammation of the rectum.  Abnormal gray-green frothy vaginal discharge  Vaginal itching and irritation  Itching and irritation of the area outside the vagina.   Painful urination.  Bleeding after sexual intercourse.  It can cause longstanding (chronic) pelvic pain after frequent infections.  TREATMENT  It is important to finish ALL medications given to you.  If you have Gonorrhea or Chlamydia it can leave to PID, which can cause women to not be able to have children (sterile) if left untreated or if half-treated.   This is a sexually transmitted infection. So you are also at risk for other sexually transmitted diseases, including HIV (AIDS), it is recommended that you get tested as described above. HOME CARE INSTRUCTIONS  Warning: This infection is contagious. Do not have sex until treatment is completed. Follow up at your caregiver's office or the clinic to which you were referred. If your diagnosis (learning what is wrong) is confirmed by culture or some other method, your recent sexual contacts need treatment. Even if they are symptom free or have a negative culture or evaluation, they should be treated.  PREVENTION  Practice safe sex, use condoms, have only one sex partner and be sure your sex partner is not having sex with others.  Ask your caregiver to test you for Gonorrhea and Chlamydia at your regular checkups or  sooner if you are having symptoms.  Ask for further information if you are pregnant.  SEEK IMMEDIATE MEDICAL CARE IF:  You develop an oral temperature above 102 F (38.9 C), not controlled by medications or lasting more than 2 days.  You develop an increase in pain.  You develop any type of abnormal discharge.  You develop  vaginal bleeding and it is not time for your period.  You develop painful intercourse.  Additional Information:  Your vital signs today were: BP 123/69    Pulse 80    Temp 98.6 F (37 C) (Oral)    Resp (!) 79    LMP 07/11/2018    SpO2 100%  If your blood pressure (BP) was elevated above 135/85 this visit, please have this repeated by your doctor within one month. ---------------

## 2018-08-12 NOTE — ED Triage Notes (Signed)
Pt had + pregnancy test 2 days ago then started having vaginal bleeding with what looked like tissue.  C/o lower back pain and LLQ abd pain.  Today several times pt has been spotting, brown,bright pink blood.  Last intercourse  1 week ago.  No contraception used. 98.6  G3P2,SAB1 Pt wants STD check.

## 2018-08-12 NOTE — ED Provider Notes (Addendum)
[facility] Provider Note   CSN: [id number] Arrival date & time: 08/12/18  1850     History   Chief Complaint Chief Complaint  Patient presents with  . Vaginal Bleeding  . Abdominal Pain    HPI Melinda Powell is a 22 y.o. female with a history of hypertension, prior miscarriage she presents emergency department today for vaginal bleeding.  Patient reports that 2 days ago she took a home pregnancy test that was positive, however states this was very faint second line.  She reports that the day following she started having a large amount of vaginal bleeding and started passing "tissue".  She denies passing clots.  She notes associated lower left quadrant abdominal pain as well as left-sided back pain.  She notes that this morning the bleeding has now turned to spotting.  Her pain has improved.  She reports that she has been taking ibuprofen and Motrin for her symptoms with mild relief.  She denies any fever, chills, upper abdominal pain, flank pain, dysuria, nausea/vomiting/diarrhea.  Patient also reports that she would like to be tested for STDs.  Patient reports that she has had 3 prior pregnancies, with one miscarriage. LMP 07/11/2018.  HPI  Past Medical History:  Diagnosis Date  . Anemia   . Gestational diabetes    glyburide  . Hx of varicella   . Hypertension   . Miscarriage     Patient Active Problem List   Diagnosis Date Noted  . 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 Gestational diabetes mellitus treated with oral hypoglycemic therapy 09/07/2015    Past Surgical History:  Procedure Laterality Date  . NO PAST SURGERIES       OB History    Gravida  4   Para  1   Term  1   Preterm      AB  2   Living  1     SAB  1   TAB  1   Ectopic      Multiple  0   Live Births  1            Home Medications    Prior to Admission medications   Medication Sig Start Date  End Date Taking? Authorizing Provider  doxycycline (VIBRAMYCIN) 100 MG capsule Take 1 capsule (100 mg total) by mouth 2 (two) times daily. 03/24/18   Muthersbaugh, Dahlia ClientHannah, PA-C  fluticasone (FLONASE) 50 MCG/ACT nasal spray Place 1 spray into both nostrils daily. Patient not taking: Reported on 03/24/2018 12/14/17   Linus MakoBurky, Natalie B, NP  metroNIDAZOLE (FLAGYL) 500 MG tablet Take 1 tablet (500 mg total) by mouth 2 (two) times daily. One po bid x 7 days 03/24/18   Muthersbaugh, Dahlia ClientHannah, PA-C    Family History Family History  Problem Relation Age of Onset  . Diabetes Mother   . Diabetes Father     Social History Social History   Tobacco Use  . Smoking status: Current Every Day Smoker    Packs/day: 0.25    Types: Cigarettes  . Smokeless tobacco: Never Used  Substance Use Topics  . Alcohol use: Yes    Comment: occ  . Drug use: Yes    Types: Marijuana     Allergies   Patient has no known allergies.   Review of Systems Review of Systems  All other systems reviewed and are negative.    Physical Exam Updated Vital Signs BP 123/69   Pulse 80  Temp 98.6 F (37 C) (Oral)   Resp (!) 79   LMP 07/11/2018   SpO2 100%   Physical Exam  Constitutional: She appears well-developed and well-nourished.  HENT:  Head: Normocephalic and atraumatic.  Right Ear: External ear normal.  Left Ear: External ear normal.  Nose: Nose normal.  Mouth/Throat: Uvula is midline, oropharynx is clear and moist and mucous membranes are normal. No tonsillar exudate.  Eyes: Pupils are equal, round, and reactive to light. Right eye exhibits no discharge. Left eye exhibits no discharge. No scleral icterus.  Neck: Trachea normal. Neck supple. No spinous process tenderness present. No neck rigidity. Normal range of motion present.  Cardiovascular: Normal rate, regular rhythm and intact distal pulses.  No murmur heard. Pulses:      Radial pulses are 2+ on the right side, and 2+ on the left side.        Dorsalis pedis pulses are 2+ on the right side, and 2+ on the left side.       Posterior tibial pulses are 2+ on the right side, and 2+ on the left side.  No lower extremity swelling or edema. Calves symmetric in size bilaterally.  Pulmonary/Chest: Effort normal and breath sounds normal. She exhibits no tenderness.  Abdominal: Soft. Bowel sounds are normal. She exhibits no distension. There is no tenderness. There is no rigidity, no rebound, no guarding, no CVA tenderness, no tenderness at McBurney's point and negative Murphy's sign.  Genitourinary:  Genitourinary Comments: Exam performed by Jacinto Halim, exam chaperoned Pelvic exam: normal external genitalia without evidence of trauma. VULVA: normal appearing vulva with no masses, tenderness or lesion. VAGINA: normal appearing vagina with normal color and discharge, no lesions. CERVIX: normal appearing cervix without lesions, cervical motion tenderness absent, cervical os closed with out purulent discharge; vaginal discharge - clear, Wet prep and DNA probe for chlamydia and GC obtained.   ADNEXA: normal adnexa in size, nontender and no masses UTERUS: uterus is normal size, shape, consistency and nontender.    Musculoskeletal: She exhibits no edema.  Lymphadenopathy:    She has no cervical adenopathy.  Neurological: She is alert.  Skin: Skin is warm and dry. No rash noted. She is not diaphoretic.  Psychiatric: She has a normal mood and affect.  Nursing note and vitals reviewed.    ED Treatments / Results  Labs (all labs ordered are listed, but only abnormal results are displayed) Labs Reviewed  WET PREP, GENITAL - Abnormal; Notable for the following components:      Result Value   Clue Cells Wet Prep HPF POC PRESENT (*)    WBC, Wet Prep HPF POC FEW (*)    All other components within normal limits  URINALYSIS, ROUTINE W REFLEX MICROSCOPIC - Abnormal; Notable for the following components:   APPearance HAZY (*)    All other  components within normal limits  CBC WITH DIFFERENTIAL/PLATELET - Abnormal; Notable for the following components:   Hemoglobin 8.7 (*)    HCT 30.1 (*)    MCV 73.1 (*)    MCH 21.1 (*)    MCHC 28.9 (*)    RDW 18.3 (*)    All other components within normal limits  COMPREHENSIVE METABOLIC PANEL - Abnormal; Notable for the following components:   AST 14 (*)    Alkaline Phosphatase 33 (*)    All other components within normal limits  HCG, QUANTITATIVE, PREGNANCY  RPR  HIV ANTIBODY (ROUTINE TESTING W REFLEX)  I-STAT BETA HCG BLOOD, ED ([facility], [facility],  [facility] ONLY)  ABO/RH  TYPE AND SCREEN  GC/CHLAMYDIA PROBE AMP ([place]) NOT AT [facility]    EKG None  Radiology No results found.  Procedures Procedures (including critical care time)  Medications Ordered in ED Medications  acetaminophen (TYLENOL) tablet 650 mg (has no administration in time range)  cefTRIAXone (ROCEPHIN) injection 250 mg (has no administration in time range)  azithromycin (ZITHROMAX) tablet 1,000 mg (has no administration in time range)     Initial Impression / Assessment and Plan / ED Course  I have reviewed the triage vital signs and the nursing notes.  Pertinent labs & imaging results that were available during my care of the patient were reviewed by me and considered in my medical decision making (see chart for details).     22 y.o. female who presents emergency department today for vaginal bleeding and lower abdominal pain.  Patient reports that she had a home pregnancy test that was positive however had very faint lines.  She has been having vaginal bleeding over the last 2 days and now spotting.  She initially had pain that is now improved.  Vital signs are reassuring on presentation.  No tachycardia or hypotension.  Abdominal exam is benign.  GU exam chaperoned and with cervical os that is closed.  No cervical motion tenderness to make me concern for PID.  Patient also like to be tested for STDs.  Patient pregnancy  test is negative.  Patient last menstrual cycle was on 11/6.  Possibly patient onset of menses.  Do not feel she requires ultrasound at this current time.  She is hemodynamically stable.  Patient with baseline anemia.  No leukocytosis.  No evidence of UTI.  Wet prep with evidence of BV.  Will treat with Flagyl.  Patient instructed not drink alcohol while taking Flagyl.  Patient was prophylactically treated for gonorrhea and chlamydia. Repeat abdominal exam without peritoneal signs of focal ttp. Labs reviewed and reassuring. No indication for imaging or further workup at this time.  Will have patient follow-up with OB/GYN versus PCP.  Return precautions were discussed.  Patient appears safe for discharge.   Patient case discussed with Dr. Rodena Medin who is in agreement with plan.  Final Clinical Impressions(s) / ED Diagnoses   Final diagnoses:  Vaginal bleeding    ED Discharge Orders         Ordered    metroNIDAZOLE (FLAGYL) 500 MG tablet  2 times daily with meals     08/12/18 2105           Princella Pellegrini 08/12/18 2114    Jacinto Halim, PA-C 08/12/18 2114    Wynetta Fines, MD 08/13/18 [phone]

## 2018-08-13 LAB — ABO/RH: ABO/RH(D): A POS

## 2018-08-13 LAB — TYPE AND SCREEN
ABO/RH(D): A POS
ANTIBODY SCREEN: POSITIVE

## 2018-08-13 LAB — HIV ANTIBODY (ROUTINE TESTING W REFLEX): HIV SCREEN 4TH GENERATION: NONREACTIVE

## 2018-08-13 LAB — RPR: RPR Ser Ql: NONREACTIVE

## 2018-08-13 LAB — GC/CHLAMYDIA PROBE AMP (~~LOC~~) NOT AT ARMC
Chlamydia: NEGATIVE
Neisseria Gonorrhea: NEGATIVE

## 2020-01-23 IMAGING — CT CT HEAD W/O CM
3 series · 15 of 47 positions shown, 18 images · non-contrast
Comparison: None.

CLINICAL DATA: Per EMS pt unwitnessed fall resulting in laceration
above left eye.

EXAM:
CT HEAD WITHOUT CONTRAST
TECHNIQUE: Contiguous axial images were obtained from the base of the skull
through the vertex without intravenous contrast.

[Series 2: head wo · axial · 0.43mm/px · z∈[-76,+54]mm · 9 of 32 slices shown, 12 images]
[im 3/32  brain]
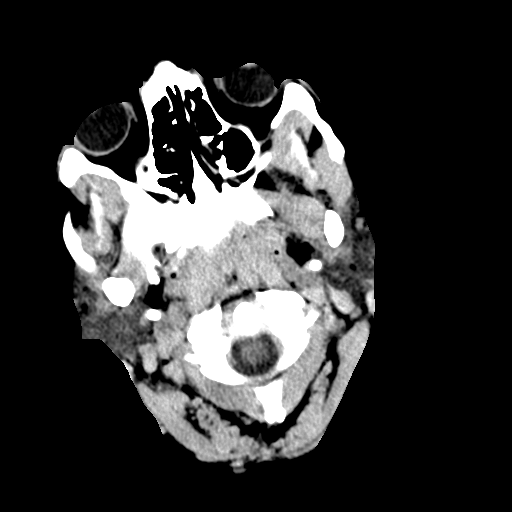
[im 3/32  bone]
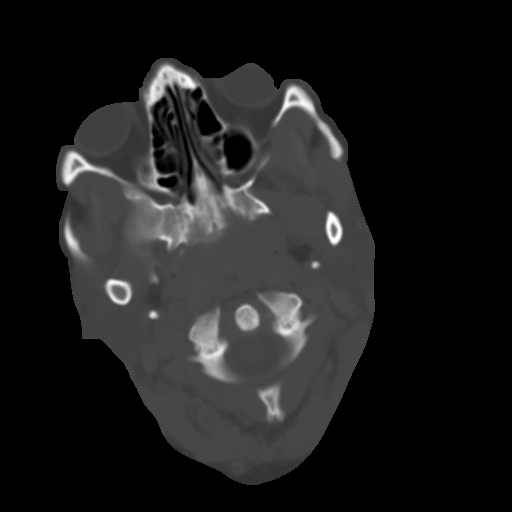
[im 6/32  brain]
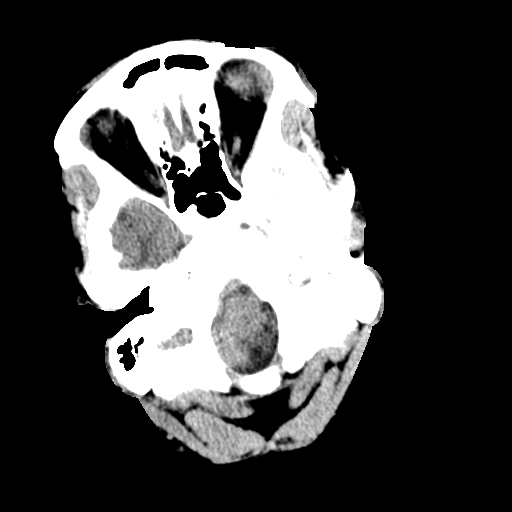
[im 9/32  brain]
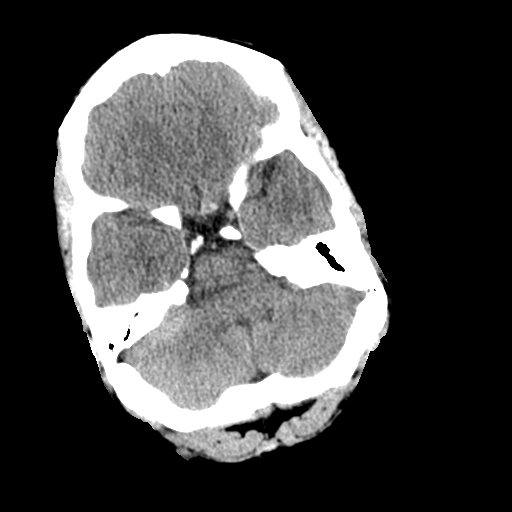
[im 12/32  brain]
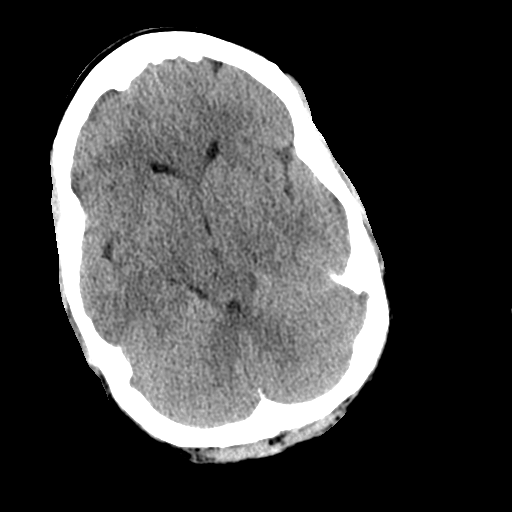
[im 17/32  brain]
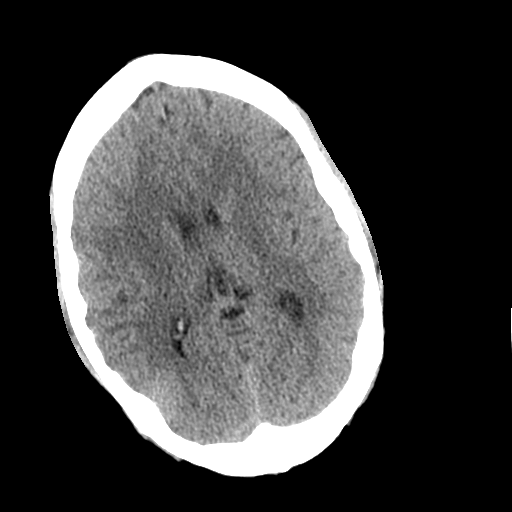
[im 17/32  bone]
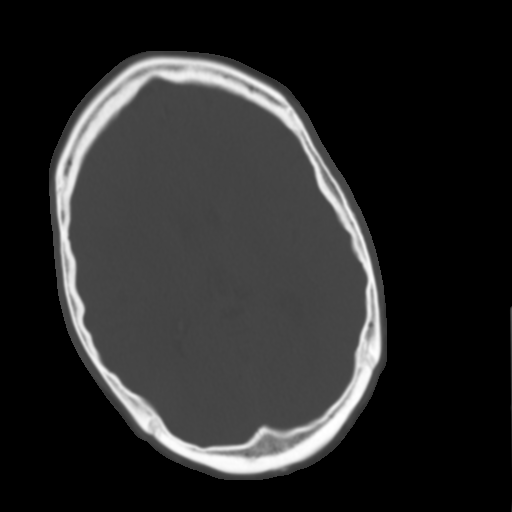
[im 20/32  brain]
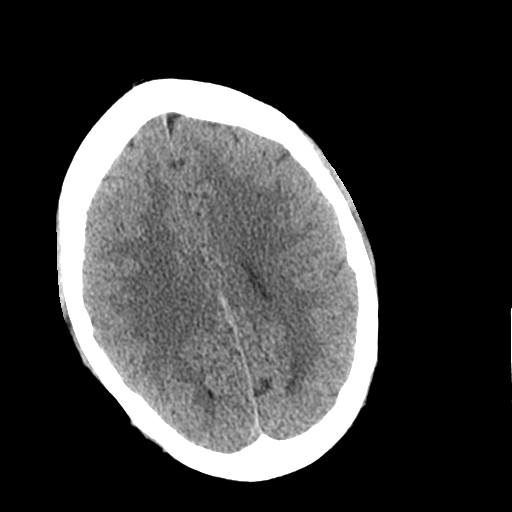
[im 23/32  brain]
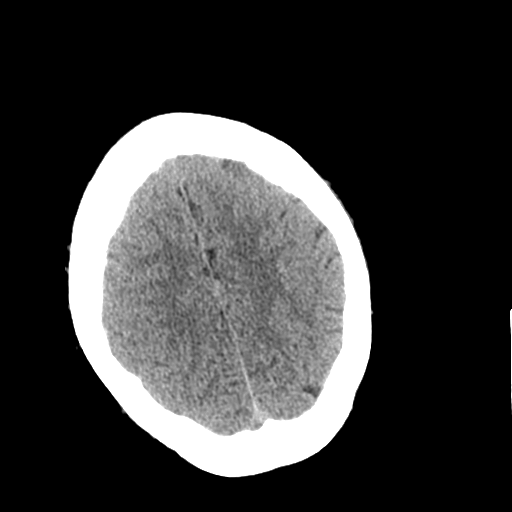
[im 26/32  brain]
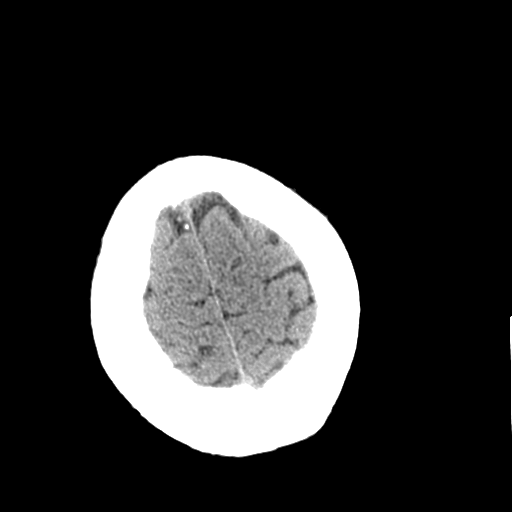
[im 29/32  brain]
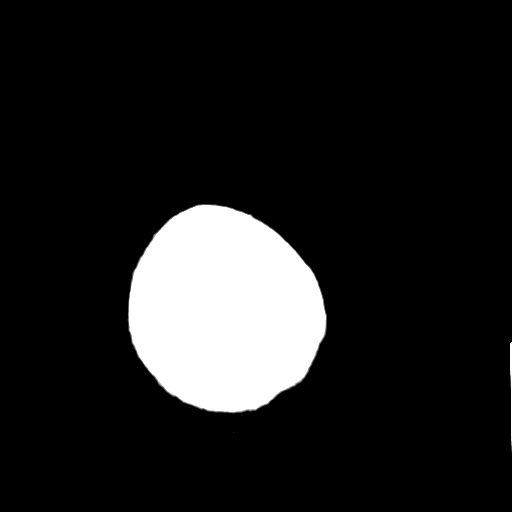
[im 29/32  bone]
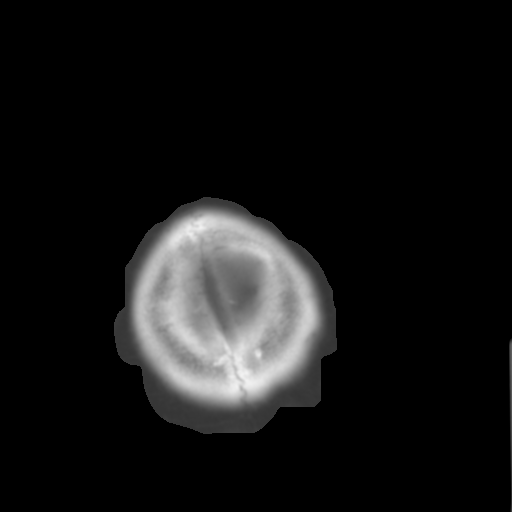

[Series 5: coronal soft tissue · coronal · 0.34mm/px · 3 of 73 slices shown]
[im 25/73  brain]
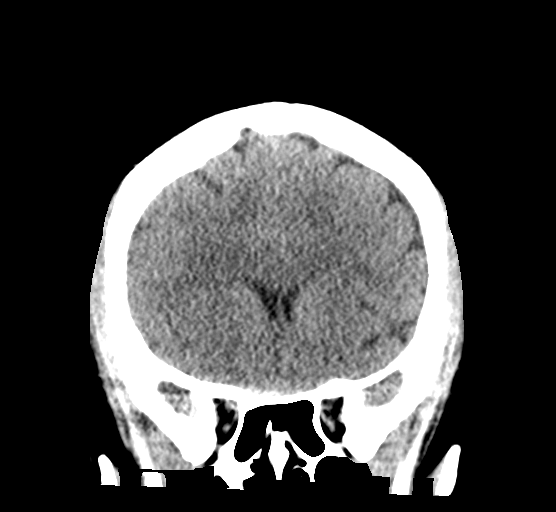
[im 33/73  brain]
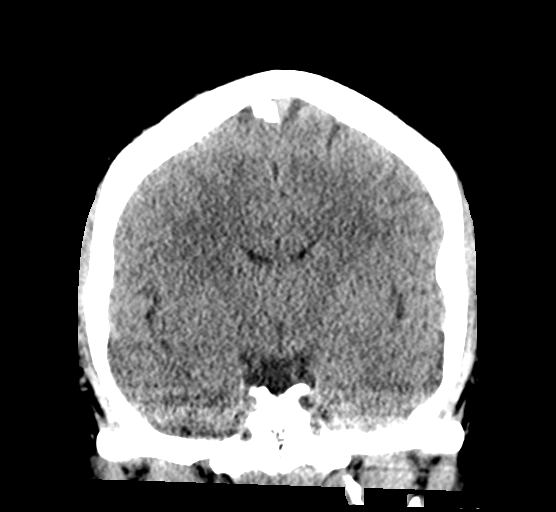
[im 41/73  brain]
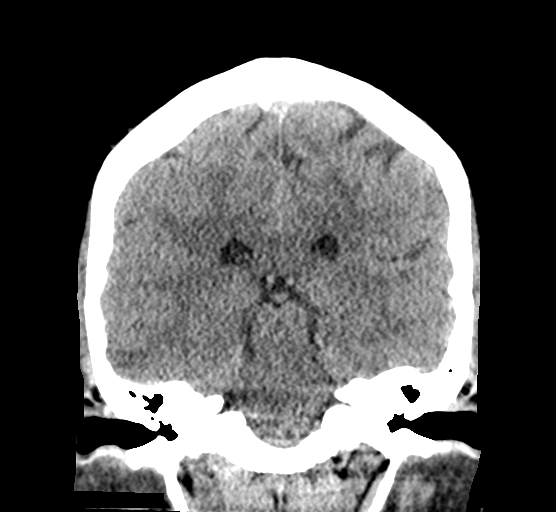

[Series 6: sagittal soft tissue · sagittal · 0.31mm/px · 3 of 55 slices shown]
[im 19/55  brain]
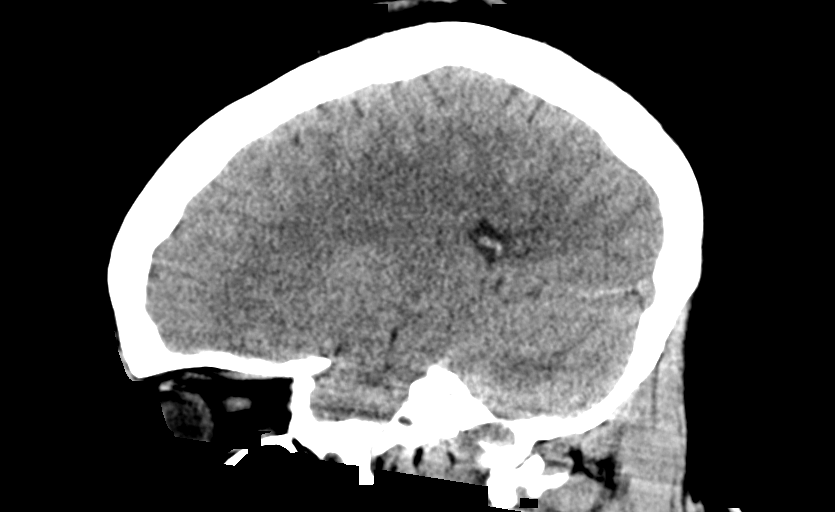
[im 28/55  brain]
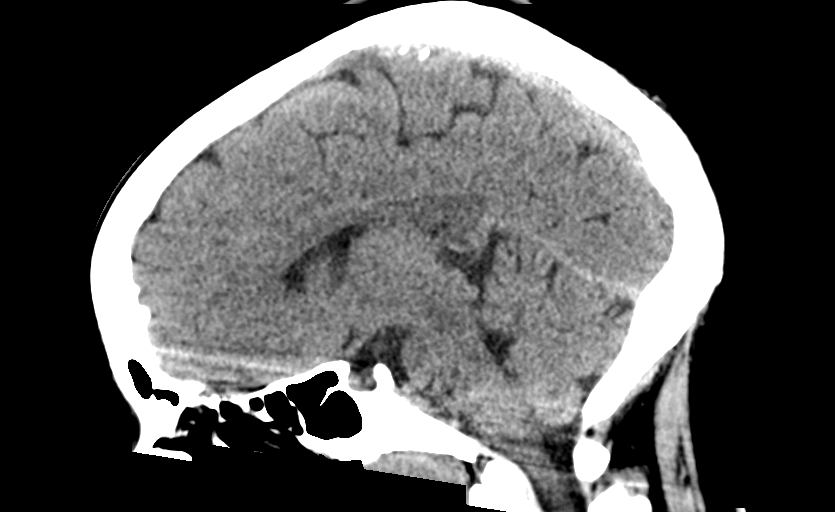
[im 37/55  brain]
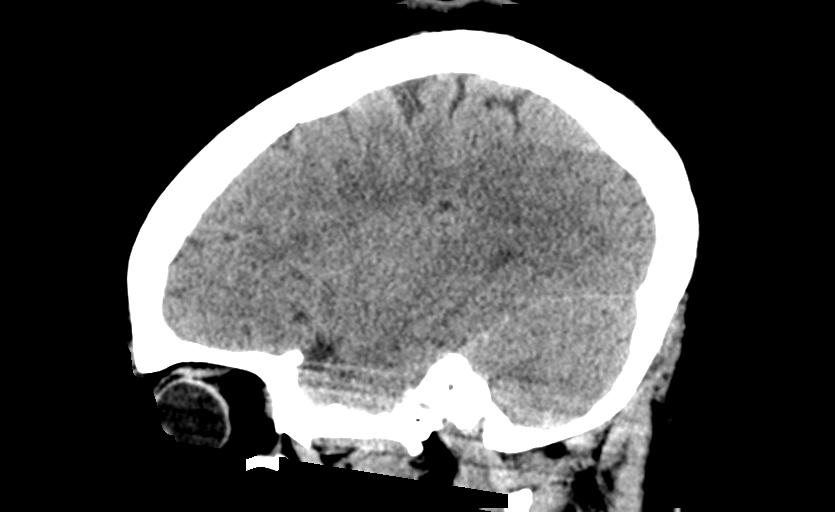

[15 of 47 positions shown; findings below may reference images not displayed]

FINDINGS: Brain: No evidence of acute infarction, hemorrhage, hydrocephalus,
extra-axial collection or mass lesion/mass effect.

Vascular: No hyperdense vessel or unexpected calcification.

Skull: No osseous abnormality.

Sinuses/Orbits: Visualized paranasal sinuses are clear. Visualized
mastoid sinuses are clear. Visualized orbits demonstrate no focal
abnormality.

Other: None
IMPRESSION: No acute intracranial pathology.

## 2022-11-21 ENCOUNTER — Other Ambulatory Visit: Payer: Self-pay | Admitting: Oncology

## 2022-11-21 DIAGNOSIS — D508 Other iron deficiency anemias: Secondary | ICD-10-CM

## 2022-11-21 NOTE — Progress Notes (Incomplete)
[facility]  [address] [place],  [place]  [address] [phone]  Clinic Day:  11/22/2022  Referring physician: Maggie Schwalbe, PA-C   HISTORY OF PRESENT ILLNESS:  The patient is a 27 y.o. female  who I was asked to consult upon for iron deficiency anemia.  Recent labs showed a low hemoglobin of 9.0, with a low MCV of 64.9.  Iron studies done recently showed a low ferritin of 2, a low serum iron of 12, an elevated TIBC of 498, and a low iron saturation of 4%.  Although she claims her menstrual cycles are not particularly heavy, they do last 6 to 7 days at a time.  Sometimes her menstrual cycles, 1 twice per month.  She recalls having a uterine polyp removed last year.  She denies having other overt forms of blood loss to explain her iron deficiency anemia.  She has tried to take oral iron in the past, but is not compliant with taking it on a routine basis.  She claims to have had a Pap smear and pelvic exam last year, which both came back normal.  She claims her sister also has issues with anemia.  Of note, the patient does have cravings for both salty foods and ice, consistent with pica.  PAST MEDICAL HISTORY:   Past Medical History:  Diagnosis Date   Anemia    Gestational diabetes    glyburide   Hx of varicella    Hypertension    Miscarriage     PAST SURGICAL HISTORY:   Past Surgical History:  Procedure Laterality Date   POLYPECTOMY     UTERINE    CURRENT MEDICATIONS:   Current Outpatient Medications  Medication Sig Dispense Refill   doxycycline (VIBRAMYCIN) 100 MG capsule Take 1 capsule (100 mg total) by mouth 2 (two) times daily. 28 capsule 0   fluticasone (FLONASE) 50 MCG/ACT nasal spray Place 1 spray into both nostrils daily. (Patient not taking: Reported on 03/24/2018) 16 g 2   metroNIDAZOLE (FLAGYL) 500 MG tablet Take 1 tablet (500 mg total) by mouth 2 (two) times daily with a meal. DO NOT CONSUME ALCOHOL WHILE TAKING THIS  MEDICATION. 14 tablet 0   No current facility-administered medications for this visit.    ALLERGIES:  No Known Allergies  FAMILY HISTORY:   Family History  Problem Relation Age of Onset   Diabetes Mother    Stomach cancer Mother    Hyperlipidemia Mother    Diabetes Father    Lupus Half-Sister    Asthma Half-Brother     SOCIAL HISTORY:  The patient was born and raised in [place], [place].  She currently lives in town.  She has 2 children, ages 66 and 4.  She has her own Paediatric nurse.  She admits to previous marijuana use and alcohol use, but has abstained from these activities for over the past 5 months.  REVIEW OF SYSTEMS:  Review of Systems  Constitutional:  Positive for fatigue. Negative for fever.  HENT:   Negative for hearing loss and sore throat.   Eyes:  Positive for eye problems.  Respiratory:  Positive for shortness of breath. Negative for chest tightness, cough and hemoptysis.   Cardiovascular:  Negative for chest pain and palpitations.  Gastrointestinal:  Negative for abdominal distention, abdominal pain, blood in stool, constipation, diarrhea, nausea and vomiting.  Endocrine: Negative for hot flashes.  Genitourinary:  Negative for difficulty urinating, dysuria, frequency, hematuria and nocturia.   Musculoskeletal:  Positive for arthralgias and myalgias. Negative for back pain and gait problem.  Skin: Negative.  Negative for itching and rash.  Neurological: Negative.  Negative for dizziness, extremity weakness, gait problem, headaches, light-headedness and numbness.  Hematological: Negative.   Psychiatric/Behavioral: Negative.  Negative for depression and suicidal ideas. The patient is not nervous/anxious.      PHYSICAL EXAM:  Blood pressure (!) 140/86, pulse 82, temperature 99.8 F (37.7 C), resp. rate 16, height 5\' 6"  (1.676 m), weight 216 lb 9.6 oz (98.2 kg), SpO2 98 %. Wt Readings from Last 3 Encounters:  11/22/22 216 lb 9.6 oz (98.2 kg)  03/23/18 165  lb (74.8 kg)  01/18/18 180 lb (81.6 kg)   Body mass index is 34.96 kg/m. Performance status (ECOG): 0 - Asymptomatic Physical Exam Constitutional:      Appearance: Normal appearance. She is not ill-appearing.  HENT:     Mouth/Throat:     Mouth: Mucous membranes are moist.     Pharynx: Oropharynx is clear. No oropharyngeal exudate or posterior oropharyngeal erythema.  Cardiovascular:     Rate and Rhythm: Normal rate and regular rhythm.     Heart sounds: No murmur heard.    No friction rub. No gallop.  Pulmonary:     Effort: Pulmonary effort is normal. No respiratory distress.     Breath sounds: Normal breath sounds. No wheezing, rhonchi or rales.  Abdominal:     General: Bowel sounds are normal. There is no distension.     Palpations: Abdomen is soft. There is no mass.     Tenderness: There is no abdominal tenderness.  Musculoskeletal:        General: No swelling.     Right lower leg: No edema.     Left lower leg: No edema.  Lymphadenopathy:     Cervical: No cervical adenopathy.     Upper Body:     Right upper body: No supraclavicular or axillary adenopathy.     Left upper body: No supraclavicular or axillary adenopathy.     Lower Body: No right inguinal adenopathy. No left inguinal adenopathy.  Skin:    General: Skin is warm.     Coloration: Skin is not jaundiced.     Findings: No lesion or rash.  Neurological:     General: No focal deficit present.     Mental Status: She is alert and oriented to person, place, and time. Mental status is at baseline.  Psychiatric:        Mood and Affect: Mood normal.        Behavior: Behavior normal.        Thought Content: Thought content normal.    LABS:     Latest Reference Range & Units 11/22/22 15:23  Folate >5.9 ng/mL 10.3  Vitamin B12 180 - 914 pg/mL 364   ASSESSMENT & PLAN:  A 27 y.o. female who I was asked to consult upon for iron deficiency anemia, which appears to be related to her menstrual cycles.  I will arrange  for her to receive IV iron over these next few weeks to rapidly replenish her iron stores and normalize her hemoglobin.  I will see her back in 3 months to reassess her iron and hemoglobin levels to see how well she responded to her upcoming IV iron.  The patient understands all the plans discussed today and is in agreement with them.  I do appreciate Nodal, Alphonzo Dublin, PA-C for his new consult.   Eleanora Guinyard Macarthur Critchley, MD

## 2022-11-22 ENCOUNTER — Inpatient Hospital Stay: Payer: Medicaid Other | Attending: Oncology | Admitting: Oncology

## 2022-11-22 ENCOUNTER — Encounter: Payer: Self-pay | Admitting: Oncology

## 2022-11-22 ENCOUNTER — Other Ambulatory Visit: Payer: Self-pay | Admitting: Oncology

## 2022-11-22 ENCOUNTER — Inpatient Hospital Stay: Payer: Medicaid Other

## 2022-11-22 VITALS — BP 140/86 | HR 82 | Temp 99.8°F | Resp 16 | Ht 66.0 in | Wt 216.6 lb

## 2022-11-22 DIAGNOSIS — D508 Other iron deficiency anemias: Secondary | ICD-10-CM

## 2022-11-22 DIAGNOSIS — N920 Excessive and frequent menstruation with regular cycle: Secondary | ICD-10-CM

## 2022-11-22 DIAGNOSIS — D500 Iron deficiency anemia secondary to blood loss (chronic): Secondary | ICD-10-CM | POA: Insufficient documentation

## 2022-11-22 LAB — CBC: RBC: 4.37 (ref 3.87–5.11)

## 2022-11-22 LAB — FOLATE: Folate: 10.3 ng/mL (ref >5.9)

## 2022-11-22 LAB — CBC AND DIFFERENTIAL
HCT: 28 — AB (ref 36–46)
Hemoglobin: 8.5 — AB (ref 12.0–16.0)
Neutrophils Absolute: 4.31
Platelets: 310 10*3/uL (ref 150–400)
WBC: 7.7

## 2022-11-22 LAB — VITAMIN B12: Vitamin B-12: 364 pg/mL (ref 180–914)

## 2022-11-23 ENCOUNTER — Encounter: Payer: Self-pay | Admitting: Oncology

## 2022-11-23 MED FILL — Ferumoxytol Inj 510 MG/17ML (30 MG/ML) (Elemental Fe): INTRAVENOUS | Qty: 17 | Status: AC

## 2022-11-24 ENCOUNTER — Inpatient Hospital Stay: Payer: Medicaid Other

## 2022-11-24 VITALS — BP 138/71 | HR 77 | Temp 98.0°F | Resp 18

## 2022-11-24 DIAGNOSIS — D500 Iron deficiency anemia secondary to blood loss (chronic): Secondary | ICD-10-CM | POA: Diagnosis not present

## 2022-11-24 MED ORDER — SODIUM CHLORIDE 0.9 % IV SOLN
510.0000 mg | Freq: Once | INTRAVENOUS | Status: AC
  Administered 2022-11-24: 510 mg via INTRAVENOUS
  Filled 2022-11-24: qty 510

## 2022-11-24 MED ORDER — SODIUM CHLORIDE 0.9 % IV SOLN: Freq: Once | INTRAVENOUS | Status: AC

## 2022-11-24 NOTE — Patient Instructions (Signed)

## 2022-11-29 ENCOUNTER — Encounter: Payer: Self-pay | Admitting: Oncology

## 2022-11-29 MED FILL — Ferumoxytol Inj 510 MG/17ML (30 MG/ML) (Elemental Fe): INTRAVENOUS | Qty: 17 | Status: AC

## 2022-11-30 ENCOUNTER — Inpatient Hospital Stay: Payer: Medicaid Other

## 2022-11-30 VITALS — BP 123/74 | HR 74 | Temp 98.2°F | Resp 18 | Ht 66.0 in | Wt 216.0 lb

## 2022-11-30 DIAGNOSIS — D500 Iron deficiency anemia secondary to blood loss (chronic): Secondary | ICD-10-CM

## 2022-11-30 MED ORDER — SODIUM CHLORIDE 0.9 % IV SOLN: Freq: Once | INTRAVENOUS | Status: AC

## 2022-11-30 MED ORDER — SODIUM CHLORIDE 0.9 % IV SOLN
510.0000 mg | Freq: Once | INTRAVENOUS | Status: AC
  Administered 2022-11-30: 510 mg via INTRAVENOUS
  Filled 2022-11-30: qty 510

## 2022-11-30 NOTE — Patient Instructions (Signed)

## 2022-12-01 ENCOUNTER — Ambulatory Visit: Payer: Medicaid Other

## 2023-02-22 ENCOUNTER — Inpatient Hospital Stay: Payer: Medicaid Other

## 2023-02-22 ENCOUNTER — Inpatient Hospital Stay: Payer: Medicaid Other | Admitting: Oncology

## 2023-02-27 NOTE — Progress Notes (Signed)
[facility]  [address] [place],  [place]  [address] [phone]  Clinic Day:  6/252024  Referring physician: Leane Call, PA-C   HISTORY OF PRESENT ILLNESS:  The patient is a 27 y.o. female  who I recently began seeing for iron deficiency anemia secondary to heavy menstrual cycles.  She comes in today to review her iron and hemoglobin levels after receiving IV iron.  Although she feels better, her cycles remain very heavy.  She claims more of her cycles have thick clots within them.  She continues to deny having other over forms of blood loss.  PHYSICAL EXAM:  Blood pressure (!) 149/106, pulse 82, temperature 98.5 F (36.9 C), resp. rate 16, height 5\' 6"  (1.676 m), weight 230 lb 4.8 oz (104.5 kg), SpO2 100 %. Wt Readings from Last 3 Encounters:  02/28/23 230 lb 4.8 oz (104.5 kg)  11/30/22 216 lb (98 kg)  11/22/22 216 lb 9.6 oz (98.2 kg)   Body mass index is 37.17 kg/m. Performance status (ECOG): 0 - Asymptomatic Physical Exam Constitutional:      Appearance: Normal appearance. She is not ill-appearing.  HENT:     Mouth/Throat:     Mouth: Mucous membranes are moist.     Pharynx: Oropharynx is clear. No oropharyngeal exudate or posterior oropharyngeal erythema.  Cardiovascular:     Rate and Rhythm: Normal rate and regular rhythm.     Heart sounds: No murmur heard.    No friction rub. No gallop.  Pulmonary:     Effort: Pulmonary effort is normal. No respiratory distress.     Breath sounds: Normal breath sounds. No wheezing, rhonchi or rales.  Abdominal:     General: Bowel sounds are normal. There is no distension.     Palpations: Abdomen is soft. There is no mass.     Tenderness: There is no abdominal tenderness.  Musculoskeletal:        General: No swelling.     Right lower leg: No edema.     Left lower leg: No edema.  Lymphadenopathy:     Cervical: No cervical adenopathy.     Upper Body:     Right upper body: No  supraclavicular or axillary adenopathy.     Left upper body: No supraclavicular or axillary adenopathy.     Lower Body: No right inguinal adenopathy. No left inguinal adenopathy.  Skin:    General: Skin is warm.     Coloration: Skin is not jaundiced.     Findings: No lesion or rash.  Neurological:     General: No focal deficit present.     Mental Status: She is alert and oriented to person, place, and time. Mental status is at baseline.  Psychiatric:        Mood and Affect: Mood normal.        Behavior: Behavior normal.        Thought Content: Thought content normal.   LABS:    Latest Reference Range & Units 02/28/23 00:00  WBC  7.5 (E)  RBC 3.87 - 5.11  4.09 (E)  Hemoglobin 12.0 - 16.0  10.6 ! (E)  HCT 36 - 46  33 ! (E)  Platelets 150 - 400 K/uL 247 (E)  NEUT#  4.20 (E)  !: Data is abnormal (E): External lab result  IRON STUDIES PENDING   ASSESSMENT & PLAN:  A 27 y.o. female with iron deficiency anemia.  Although better from her recent IV iron, her hemoglobin remains  suboptimal.  This is likely due to her persistently heavy menstrual cycles, for which she is scheduled to see gynecology in the forthcoming days.  I will arrange for her to receive another course of IV iron in the forthcoming weeks.  Otherwise,  I will see her back in 3 months to reassess her iron and hemoglobin levels to see how well she responded to her upcoming IV iron.  The patient understands all the plans discussed today and is in agreement with them.  Flavia Bruss Kirby Funk, MD

## 2023-02-28 ENCOUNTER — Inpatient Hospital Stay: Payer: Medicaid Other | Attending: Oncology

## 2023-02-28 ENCOUNTER — Inpatient Hospital Stay (INDEPENDENT_AMBULATORY_CARE_PROVIDER_SITE_OTHER): Payer: Medicaid Other | Admitting: Oncology

## 2023-02-28 VITALS — BP 149/106 | HR 82 | Temp 98.5°F | Resp 16 | Ht 66.0 in | Wt 230.3 lb

## 2023-02-28 DIAGNOSIS — N920 Excessive and frequent menstruation with regular cycle: Secondary | ICD-10-CM | POA: Insufficient documentation

## 2023-02-28 DIAGNOSIS — D500 Iron deficiency anemia secondary to blood loss (chronic): Secondary | ICD-10-CM

## 2023-02-28 LAB — CBC: RBC: 4.09 (ref 3.87–5.11)

## 2023-02-28 LAB — CBC AND DIFFERENTIAL
HCT: 33 — AB (ref 36–46)
Hemoglobin: 10.6 — AB (ref 12.0–16.0)
Neutrophils Absolute: 4.2
Platelets: 247 10*3/uL (ref 150–400)
WBC: 7.5

## 2023-02-28 LAB — IRON AND TIBC
Iron: 48 ug/dL (ref 28–170)
Saturation Ratios: 11 % (ref 10.4–31.8)
TIBC: 423 ug/dL (ref 250–450)
UIBC: 375 ug/dL

## 2023-02-28 LAB — FERRITIN: Ferritin: 4 ng/mL — ABNORMAL LOW (ref 11–307)

## 2023-03-01 ENCOUNTER — Other Ambulatory Visit: Payer: Self-pay | Admitting: Oncology

## 2023-03-01 ENCOUNTER — Encounter: Payer: Self-pay | Admitting: Oncology

## 2023-03-02 ENCOUNTER — Telehealth: Payer: Self-pay | Admitting: Oncology

## 2023-03-02 ENCOUNTER — Encounter: Payer: Self-pay | Admitting: Oncology

## 2023-03-02 NOTE — Telephone Encounter (Signed)
Patient has been scheduled. Aware of appt date and time    Scheduling Message Entered by Rennis Harding A on 03/01/2023 at  1:56 AM Priority: Routine <No visit type provided>  Department: [facility]  Provider:  Scheduling Notes:  Iv iron asap  Labs/appt on 05-31-23

## 2023-03-06 MED FILL — Ferumoxytol Inj 510 MG/17ML (30 MG/ML) (Elemental Fe): INTRAVENOUS | Qty: 17 | Status: AC

## 2023-03-07 ENCOUNTER — Inpatient Hospital Stay: Payer: Medicaid Other | Attending: Oncology

## 2023-03-07 VITALS — BP 125/78 | HR 80 | Temp 98.6°F | Resp 18

## 2023-03-07 DIAGNOSIS — D500 Iron deficiency anemia secondary to blood loss (chronic): Secondary | ICD-10-CM | POA: Diagnosis present

## 2023-03-07 DIAGNOSIS — N920 Excessive and frequent menstruation with regular cycle: Secondary | ICD-10-CM | POA: Diagnosis present

## 2023-03-07 MED ORDER — SODIUM CHLORIDE 0.9 % IV SOLN
510.0000 mg | Freq: Once | INTRAVENOUS | Status: AC
  Administered 2023-03-07: 510 mg via INTRAVENOUS
  Filled 2023-03-07: qty 510

## 2023-03-07 MED ORDER — SODIUM CHLORIDE 0.9 % IV SOLN: Freq: Once | INTRAVENOUS | Status: AC

## 2023-03-07 NOTE — Patient Instructions (Signed)

## 2023-03-13 MED FILL — Ferumoxytol Inj 510 MG/17ML (30 MG/ML) (Elemental Fe): INTRAVENOUS | Qty: 17 | Status: AC

## 2023-03-14 ENCOUNTER — Inpatient Hospital Stay: Payer: Medicaid Other

## 2023-03-14 VITALS — BP 128/65 | HR 82 | Temp 98.1°F | Resp 20

## 2023-03-14 DIAGNOSIS — D500 Iron deficiency anemia secondary to blood loss (chronic): Secondary | ICD-10-CM | POA: Diagnosis not present

## 2023-03-14 MED ORDER — SODIUM CHLORIDE 0.9 % IV SOLN
510.0000 mg | Freq: Once | INTRAVENOUS | Status: AC
  Administered 2023-03-14: 510 mg via INTRAVENOUS
  Filled 2023-03-14: qty 510

## 2023-03-14 MED ORDER — SODIUM CHLORIDE 0.9 % IV SOLN: Freq: Once | INTRAVENOUS | Status: AC

## 2023-03-14 NOTE — Patient Instructions (Signed)

## 2023-05-30 ENCOUNTER — Other Ambulatory Visit: Payer: Self-pay | Admitting: Oncology

## 2023-05-30 DIAGNOSIS — D500 Iron deficiency anemia secondary to blood loss (chronic): Secondary | ICD-10-CM

## 2023-05-30 NOTE — Progress Notes (Unsigned)
[facility]  [address] [place],  [place]  [address] [phone]  Clinic Day:  9/252024  Referring physician: Leane Call, PA-C   HISTORY OF PRESENT ILLNESS:  The patient is a 27 y.o. female with iron deficiency anemia secondary to heavy menstrual cycles.  She comes in today to review her iron and hemoglobin levels after receiving another course of IV iron.  Although she feels better, her cycles remain very heavy.  She claims her cycles still have clots within them.  She continues to deny having other over forms of blood loss.    PHYSICAL EXAM:  Blood pressure 139/83, pulse 67, temperature 98.1 F (36.7 C), resp. rate 16, height 5\' 6"  (1.676 m), weight 233 lb 4.8 oz (105.8 kg), SpO2 98%. Wt Readings from Last 3 Encounters:  05/31/23 233 lb 4.8 oz (105.8 kg)  02/28/23 230 lb 4.8 oz (104.5 kg)  11/30/22 216 lb (98 kg)   Body mass index is 37.66 kg/m. Performance status (ECOG): 0 - Asymptomatic Physical Exam Constitutional:      Appearance: Normal appearance. She is not ill-appearing.  HENT:     Mouth/Throat:     Mouth: Mucous membranes are moist.     Pharynx: Oropharynx is clear. No oropharyngeal exudate or posterior oropharyngeal erythema.  Cardiovascular:     Rate and Rhythm: Normal rate and regular rhythm.     Heart sounds: No murmur heard.    No friction rub. No gallop.  Pulmonary:     Effort: Pulmonary effort is normal. No respiratory distress.     Breath sounds: Normal breath sounds. No wheezing, rhonchi or rales.  Abdominal:     General: Bowel sounds are normal. There is no distension.     Palpations: Abdomen is soft. There is no mass.     Tenderness: There is no abdominal tenderness.  Musculoskeletal:        General: No swelling.     Right lower leg: No edema.     Left lower leg: No edema.  Lymphadenopathy:     Cervical: No cervical adenopathy.     Upper Body:     Right upper body: No supraclavicular or axillary  adenopathy.     Left upper body: No supraclavicular or axillary adenopathy.     Lower Body: No right inguinal adenopathy. No left inguinal adenopathy.  Skin:    General: Skin is warm.     Coloration: Skin is not jaundiced.     Findings: No lesion or rash.  Neurological:     General: No focal deficit present.     Mental Status: She is alert and oriented to person, place, and time. Mental status is at baseline.  Psychiatric:        Mood and Affect: Mood normal.        Behavior: Behavior normal.        Thought Content: Thought content normal.    LABS:    Latest Reference Range & Units 05/31/23 00:00 05/31/23 09:09  Iron 28 - 170 ug/dL  35  UIBC ug/dL  846  TIBC 962 - 952 ug/dL  841  Saturation Ratios 10.4 - 31.8 %  9 (L)  Ferritin 11 - 307 ng/mL  7 (L)  WBC  4.5 (E)   RBC 3.87 - 5.11  4.25 (E)   Hemoglobin 12.0 - 16.0  11.7 ! (E)   HCT 36 - 46  35 ! (E)   Platelets 150 - 400 K/uL 259 (E)  NEUT#  1.98 (E)   (L): Data is abnormally low !: Data is abnormal (E): External lab result   ASSESSMENT & PLAN:  A 27 y.o. female with iron deficiency anemia.  Although better from her recent IV iron, her hemoglobin and iron parameters remain suboptimal.  This is likely due to her persistently heavy menstrual cycles.  I will arrange for her to receive another course of IV iron in the forthcoming weeks.  She knows it remains important to follow with gynecology to do whatever necessary to reduce the severity of her menstrual cycles.  Otherwise, I will see her back in 3 months to reassess her iron and hemoglobin levels to see how well she responded to her upcoming IV iron.  The patient understands all the plans discussed today and is in agreement with them.  Caelyn Route Kirby Funk, MD

## 2023-05-31 ENCOUNTER — Inpatient Hospital Stay: Payer: Medicaid Other

## 2023-05-31 ENCOUNTER — Other Ambulatory Visit: Payer: Self-pay | Admitting: Oncology

## 2023-05-31 ENCOUNTER — Inpatient Hospital Stay: Payer: Medicaid Other | Attending: Oncology | Admitting: Oncology

## 2023-05-31 VITALS — BP 139/83 | HR 67 | Temp 98.1°F | Resp 16 | Ht 66.0 in | Wt 233.3 lb

## 2023-05-31 DIAGNOSIS — N920 Excessive and frequent menstruation with regular cycle: Secondary | ICD-10-CM | POA: Diagnosis present

## 2023-05-31 DIAGNOSIS — D500 Iron deficiency anemia secondary to blood loss (chronic): Secondary | ICD-10-CM | POA: Insufficient documentation

## 2023-05-31 LAB — CBC AND DIFFERENTIAL
HCT: 35 — AB (ref 36–46)
Hemoglobin: 11.7 — AB (ref 12.0–16.0)
Neutrophils Absolute: 1.98
Platelets: 259 10*3/uL (ref 150–400)
WBC: 4.5

## 2023-05-31 LAB — CBC: RBC: 4.25 (ref 3.87–5.11)

## 2023-05-31 LAB — IRON AND TIBC
Iron: 35 ug/dL (ref 28–170)
Saturation Ratios: 9 % — ABNORMAL LOW (ref 10.4–31.8)
TIBC: 372 ug/dL (ref 250–450)
UIBC: 337 ug/dL

## 2023-05-31 LAB — FERRITIN: Ferritin: 7 ng/mL — ABNORMAL LOW (ref 11–307)

## 2023-06-01 ENCOUNTER — Telehealth: Payer: Self-pay | Admitting: Oncology

## 2023-06-01 NOTE — Telephone Encounter (Signed)
Patient has been scheduled. Aware of appt date and time.   Scheduling Message Entered by Rennis Harding A on 05/31/2023 at  3:54 PM Priority: Routine <No visit type provided>  Department: [facility]  Provider:  Scheduling Notes:  IV iron next week  Labs/appt 08-31-23

## 2023-06-07 ENCOUNTER — Encounter: Payer: Self-pay | Admitting: Oncology

## 2023-06-07 MED FILL — Ferumoxytol Inj 510 MG/17ML (30 MG/ML) (Elemental Fe): INTRAVENOUS | Qty: 17 | Status: AC

## 2023-06-08 ENCOUNTER — Inpatient Hospital Stay: Payer: Medicaid Other | Attending: Oncology

## 2023-06-08 VITALS — BP 132/65 | HR 78 | Temp 98.0°F | Resp 18

## 2023-06-08 DIAGNOSIS — D500 Iron deficiency anemia secondary to blood loss (chronic): Secondary | ICD-10-CM | POA: Insufficient documentation

## 2023-06-08 DIAGNOSIS — N920 Excessive and frequent menstruation with regular cycle: Secondary | ICD-10-CM | POA: Insufficient documentation

## 2023-06-08 MED ORDER — SODIUM CHLORIDE 0.9 % IV SOLN
510.0000 mg | Freq: Once | INTRAVENOUS | Status: AC
  Administered 2023-06-08: 510 mg via INTRAVENOUS
  Filled 2023-06-08: qty 510

## 2023-06-08 MED ORDER — SODIUM CHLORIDE 0.9 % IV SOLN: Freq: Once | INTRAVENOUS | Status: AC

## 2023-06-08 NOTE — Patient Instructions (Signed)
 Ferumoxytol Injection What is this medication? FERUMOXYTOL (FER ue MOX i tol) treats low levels of iron in your body (iron deficiency anemia). Iron is a mineral that plays an important role in making red blood cells, which carry oxygen from your lungs to the rest of your body. This medicine may be used for other purposes; ask your health care provider or pharmacist if you have questions. COMMON BRAND NAME(S): Feraheme What should I tell my care team before I take this medication? They need to know if you have any of these conditions: Anemia not caused by low iron levels High levels of iron in the blood Magnetic resonance imaging (MRI) test scheduled An unusual or allergic reaction to iron, other medications, foods, dyes, or preservatives Pregnant or trying to get pregnant Breastfeeding How should I use this medication? This medication is injected into a vein. It is given by your care team in a hospital or clinic setting. Talk to your care team the use of this medication in children. Special care may be needed. Overdosage: If you think you have taken too much of this medicine contact a poison control center or emergency room at once. NOTE: This medicine is only for you. Do not share this medicine with others. What if I miss a dose? It is important not to miss your dose. Call your care team if you are unable to keep an appointment. What may interact with this medication? Other iron products This list may not describe all possible interactions. Give your health care provider a list of all the medicines, herbs, non-prescription drugs, or dietary supplements you use. Also tell them if you smoke, drink alcohol, or use illegal drugs. Some items may interact with your medicine. What should I watch for while using this medication? Visit your care team regularly. Tell your care team if your symptoms do not start to get better or if they get worse. You may need blood work done while you are taking this  medication. You may need to follow a special diet. Talk to your care team. Foods that contain iron include: whole grains/cereals, dried fruits, beans, or peas, leafy green vegetables, and organ meats (liver, kidney). What side effects may I notice from receiving this medication? Side effects that you should report to your care team as soon as possible: Allergic reactions--skin rash, itching, hives, swelling of the face, lips, tongue, or throat Low blood pressure--dizziness, feeling faint or lightheaded, blurry vision Shortness of breath Side effects that usually do not require medical attention (report to your care team if they continue or are bothersome): Flushing Headache Joint pain Muscle pain Nausea Pain, redness, or irritation at injection site This list may not describe all possible side effects. Call your doctor for medical advice about side effects. You may report side effects to FDA at 1-800-FDA-1088. Where should I keep my medication? This medication is given in a hospital or clinic. It will not be stored at home. NOTE: This sheet is a summary. It may not cover all possible information. If you have questions about this medicine, talk to your doctor, pharmacist, or health care provider.  2024 Elsevier/Gold Standard (2023-01-27 00:00:00)

## 2023-06-13 MED FILL — Ferumoxytol Inj 510 MG/17ML (30 MG/ML) (Elemental Fe): INTRAVENOUS | Qty: 17 | Status: AC

## 2023-06-14 ENCOUNTER — Inpatient Hospital Stay: Payer: Medicaid Other

## 2023-06-14 VITALS — BP 118/61 | HR 91 | Temp 98.2°F | Resp 18

## 2023-06-14 DIAGNOSIS — D500 Iron deficiency anemia secondary to blood loss (chronic): Secondary | ICD-10-CM

## 2023-06-14 MED ORDER — SODIUM CHLORIDE 0.9 % IV SOLN: Freq: Once | INTRAVENOUS | Status: AC

## 2023-06-14 MED ORDER — SODIUM CHLORIDE 0.9 % IV SOLN
510.0000 mg | Freq: Once | INTRAVENOUS | Status: AC
  Administered 2023-06-14: 510 mg via INTRAVENOUS
  Filled 2023-06-14: qty 510

## 2023-06-14 NOTE — Patient Instructions (Signed)
ferFerumoxytol Injection What is this medication? FERUMOXYTOL (FER ue MOX i tol) treats low levels of iron in your body (iron deficiency anemia). Iron is a mineral that plays an important role in making red blood cells, which carry oxygen from your lungs to the rest of your body. This medicine may be used for other purposes; ask your health care provider or pharmacist if you have questions. COMMON BRAND NAME(S): Feraheme What should I tell my care team before I take this medication? They need to know if you have any of these conditions: Anemia not caused by low iron levels High levels of iron in the blood Magnetic resonance imaging (MRI) test scheduled An unusual or allergic reaction to iron, other medications, foods, dyes, or preservatives Pregnant or trying to get pregnant Breastfeeding How should I use this medication? This medication is injected into a vein. It is given by your care team in a hospital or clinic setting. Talk to your care team the use of this medication in children. Special care may be needed. Overdosage: If you think you have taken too much of this medicine contact a poison control center or emergency room at once. NOTE: This medicine is only for you. Do not share this medicine with others. What if I miss a dose? It is important not to miss your dose. Call your care team if you are unable to keep an appointment. What may interact with this medication? Other iron products This list may not describe all possible interactions. Give your health care provider a list of all the medicines, herbs, non-prescription drugs, or dietary supplements you use. Also tell them if you smoke, drink alcohol, or use illegal drugs. Some items may interact with your medicine. What should I watch for while using this medication? Visit your care team regularly. Tell your care team if your symptoms do not start to get better or if they get worse. You may need blood work done while you are taking  this medication. You may need to follow a special diet. Talk to your care team. Foods that contain iron include: whole grains/cereals, dried fruits, beans, or peas, leafy green vegetables, and organ meats (liver, kidney). What side effects may I notice from receiving this medication? Side effects that you should report to your care team as soon as possible: Allergic reactions--skin rash, itching, hives, swelling of the face, lips, tongue, or throat Low blood pressure--dizziness, feeling faint or lightheaded, blurry vision Shortness of breath Side effects that usually do not require medical attention (report to your care team if they continue or are bothersome): Flushing Headache Joint pain Muscle pain Nausea Pain, redness, or irritation at injection site This list may not describe all possible side effects. Call your doctor for medical advice about side effects. You may report side effects to FDA at 1-800-FDA-1088. Where should I keep my medication? This medication is given in a hospital or clinic. It will not be stored at home. NOTE: This sheet is a summary. It may not cover all possible information. If you have questions about this medicine, talk to your doctor, pharmacist, or health care provider.  2024 Elsevier/Gold Standard (2023-01-27 00:00:00)

## 2023-09-14 ENCOUNTER — Ambulatory Visit: Payer: Medicaid Other | Admitting: Oncology

## 2023-09-14 ENCOUNTER — Other Ambulatory Visit: Payer: Medicaid Other

## 2023-09-17 NOTE — Progress Notes (Deleted)
 [facility]  [address] [place],  [place]  [address] [phone]  Clinic Day:  05/31/2023  Referring physician: Leane Call, PA-C   HISTORY OF PRESENT ILLNESS:  The patient is a 28 y.o. female with iron deficiency anemia secondary to heavy menstrual cycles.  She comes in today to review her iron and hemoglobin levels after receiving another course of IV iron in October 2024.  Although she feels better, her cycles remain very heavy.  She claims her cycles still have clots within them.  She continues to deny having other over forms of blood loss.    PHYSICAL EXAM:  There were no vitals taken for this visit. Wt Readings from Last 3 Encounters:  05/31/23 233 lb 4.8 oz (105.8 kg)  02/28/23 230 lb 4.8 oz (104.5 kg)  11/30/22 216 lb (98 kg)   There is no height or weight on file to calculate BMI. Performance status (ECOG): 0 - Asymptomatic Physical Exam Constitutional:      Appearance: Normal appearance. She is not ill-appearing.  HENT:     Mouth/Throat:     Mouth: Mucous membranes are moist.     Pharynx: Oropharynx is clear. No oropharyngeal exudate or posterior oropharyngeal erythema.  Cardiovascular:     Rate and Rhythm: Normal rate and regular rhythm.     Heart sounds: No murmur heard.    No friction rub. No gallop.  Pulmonary:     Effort: Pulmonary effort is normal. No respiratory distress.     Breath sounds: Normal breath sounds. No wheezing, rhonchi or rales.  Abdominal:     General: Bowel sounds are normal. There is no distension.     Palpations: Abdomen is soft. There is no mass.     Tenderness: There is no abdominal tenderness.  Musculoskeletal:        General: No swelling.     Right lower leg: No edema.     Left lower leg: No edema.  Lymphadenopathy:     Cervical: No cervical adenopathy.     Upper Body:     Right upper body: No supraclavicular or axillary adenopathy.     Left upper body: No supraclavicular or axillary  adenopathy.     Lower Body: No right inguinal adenopathy. No left inguinal adenopathy.  Skin:    General: Skin is warm.     Coloration: Skin is not jaundiced.     Findings: No lesion or rash.  Neurological:     General: No focal deficit present.     Mental Status: She is alert and oriented to person, place, and time. Mental status is at baseline.  Psychiatric:        Mood and Affect: Mood normal.        Behavior: Behavior normal.        Thought Content: Thought content normal.    LABS:    Latest Reference Range & Units 05/31/23 00:00 05/31/23 09:09  Iron 28 - 170 ug/dL  35  UIBC ug/dL  789  TIBC 381 - 017 ug/dL  510  Saturation Ratios 10.4 - 31.8 %  9 (L)  Ferritin 11 - 307 ng/mL  7 (L)  WBC  4.5 (E)   RBC 3.87 - 5.11  4.25 (E)   Hemoglobin 12.0 - 16.0  11.7 ! (E)   HCT 36 - 46  35 ! (E)   Platelets 150 - 400 K/uL 259 (E)   NEUT#  1.98 (E)   (L): Data is abnormally  Data is abnormal (E): External lab result   ASSESSMENT & PLAN:  A 28 y.o. female with iron deficiency anemia.  Although better from her recent IV iron, her hemoglobin and iron parameters remain suboptimal.  This is likely due to her persistently heavy menstrual cycles.  I will arrange for her to receive another course of IV iron in the forthcoming weeks.  She knows it remains important to follow with gynecology to do whatever necessary to reduce the severity of her menstrual cycles.  Otherwise, I will see her back in 3 months to reassess her iron and hemoglobin levels to see how well she responded to her upcoming IV iron.  The patient understands all the plans discussed today and is in agreement with them.  Clancy Leiner Kirby Funk, MD

## 2023-09-18 ENCOUNTER — Inpatient Hospital Stay: Payer: Medicaid Other | Admitting: Oncology

## 2023-09-18 ENCOUNTER — Inpatient Hospital Stay: Payer: Medicaid Other | Attending: Oncology

## 2023-10-13 ENCOUNTER — Inpatient Hospital Stay: Payer: Medicaid Other | Admitting: Oncology

## 2023-10-13 ENCOUNTER — Inpatient Hospital Stay: Payer: Medicaid Other

## 2023-10-13 ENCOUNTER — Other Ambulatory Visit: Payer: Self-pay | Admitting: Oncology

## 2023-10-13 DIAGNOSIS — D500 Iron deficiency anemia secondary to blood loss (chronic): Secondary | ICD-10-CM

## 2023-10-13 NOTE — Progress Notes (Deleted)
 [facility]  [address] [place],  [place]  [address] [phone]  Clinic Day:  9/252024  Referring physician: Hunter Mickey Browner, PA-C   HISTORY OF PRESENT ILLNESS:  The patient is a 28 y.o. female with iron deficiency anemia secondary to heavy menstrual cycles.  She comes in today to review her iron and hemoglobin levels after receiving another course of IV iron.  Although she feels better, her cycles remain very heavy.  She claims her cycles still have clots within them.  She continues to deny having other over forms of blood loss.    PHYSICAL EXAM:  There were no vitals taken for this visit. Wt Readings from Last 3 Encounters:  05/31/23 233 lb 4.8 oz (105.8 kg)  02/28/23 230 lb 4.8 oz (104.5 kg)  11/30/22 216 lb (98 kg)   There is no height or weight on file to calculate BMI. Performance status (ECOG): 0 - Asymptomatic Physical Exam Constitutional:      Appearance: Normal appearance. She is not ill-appearing.  HENT:     Mouth/Throat:     Mouth: Mucous membranes are moist.     Pharynx: Oropharynx is clear. No oropharyngeal exudate or posterior oropharyngeal erythema.  Cardiovascular:     Rate and Rhythm: Normal rate and regular rhythm.     Heart sounds: No murmur heard.    No friction rub. No gallop.  Pulmonary:     Effort: Pulmonary effort is normal. No respiratory distress.     Breath sounds: Normal breath sounds. No wheezing, rhonchi or rales.  Abdominal:     General: Bowel sounds are normal. There is no distension.     Palpations: Abdomen is soft. There is no mass.     Tenderness: There is no abdominal tenderness.  Musculoskeletal:        General: No swelling.     Right lower leg: No edema.     Left lower leg: No edema.  Lymphadenopathy:     Cervical: No cervical adenopathy.     Upper Body:     Right upper body: No supraclavicular or axillary adenopathy.     Left upper body: No supraclavicular or axillary adenopathy.      Lower Body: No right inguinal adenopathy. No left inguinal adenopathy.  Skin:    General: Skin is warm.     Coloration: Skin is not jaundiced.     Findings: No lesion or rash.  Neurological:     General: No focal deficit present.     Mental Status: She is alert and oriented to person, place, and time. Mental status is at baseline.  Psychiatric:        Mood and Affect: Mood normal.        Behavior: Behavior normal.        Thought Content: Thought content normal.   LABS:    Latest Reference Range & Units 05/31/23 00:00 05/31/23 09:09  Iron 28 - 170 ug/dL  35  UIBC ug/dL  662  TIBC 749 - 549 ug/dL  627  Saturation Ratios 10.4 - 31.8 %  9 (L)  Ferritin 11 - 307 ng/mL  7 (L)  WBC  4.5 (E)   RBC 3.87 - 5.11  4.25 (E)   Hemoglobin 12.0 - 16.0  11.7 ! (E)   HCT 36 - 46  35 ! (E)   Platelets 150 - 400 K/uL 259 (E)   NEUT#  1.98 (E)   (L): Data is abnormally low !: Data is  abnormal (E): External lab result   ASSESSMENT & PLAN:  A 28 y.o. female with iron deficiency anemia.  Although better from her recent IV iron, her hemoglobin and iron parameters remain suboptimal.  This is likely due to her persistently heavy menstrual cycles.  I will arrange for her to receive another course of IV iron in the forthcoming weeks.  She knows it remains important to follow with gynecology to do whatever necessary to reduce the severity of her menstrual cycles.  Otherwise, I will see her back in 3 months to reassess her iron and hemoglobin levels to see how well she responded to her upcoming IV iron.  The patient understands all the plans discussed today and is in agreement with them.  Caidance Sybert DELENA Kerns, MD

## 2023-10-25 NOTE — Progress Notes (Deleted)
 [facility]  [address] [place],  [place]  [address] [phone]  Clinic Day:  05/31/2023  Referring physician: Leane Call, PA-C   HISTORY OF PRESENT ILLNESS:  The patient is a 28 y.o. female with iron deficiency anemia secondary to heavy menstrual cycles.  She comes in today to review her iron and hemoglobin levels after receiving another course of IV iron in October 2024.  Although she feels better, her cycles remain very heavy.  She claims her cycles still have clots within them.  She continues to deny having other over forms of blood loss.    PHYSICAL EXAM:  There were no vitals taken for this visit. Wt Readings from Last 3 Encounters:  05/31/23 233 lb 4.8 oz (105.8 kg)  02/28/23 230 lb 4.8 oz (104.5 kg)  11/30/22 216 lb (98 kg)   There is no height or weight on file to calculate BMI. Performance status (ECOG): 0 - Asymptomatic Physical Exam Constitutional:      Appearance: Normal appearance. She is not ill-appearing.  HENT:     Mouth/Throat:     Mouth: Mucous membranes are moist.     Pharynx: Oropharynx is clear. No oropharyngeal exudate or posterior oropharyngeal erythema.  Cardiovascular:     Rate and Rhythm: Normal rate and regular rhythm.     Heart sounds: No murmur heard.    No friction rub. No gallop.  Pulmonary:     Effort: Pulmonary effort is normal. No respiratory distress.     Breath sounds: Normal breath sounds. No wheezing, rhonchi or rales.  Abdominal:     General: Bowel sounds are normal. There is no distension.     Palpations: Abdomen is soft. There is no mass.     Tenderness: There is no abdominal tenderness.  Musculoskeletal:        General: No swelling.     Right lower leg: No edema.     Left lower leg: No edema.  Lymphadenopathy:     Cervical: No cervical adenopathy.     Upper Body:     Right upper body: No supraclavicular or axillary adenopathy.     Left upper body: No supraclavicular or axillary  adenopathy.     Lower Body: No right inguinal adenopathy. No left inguinal adenopathy.  Skin:    General: Skin is warm.     Coloration: Skin is not jaundiced.     Findings: No lesion or rash.  Neurological:     General: No focal deficit present.     Mental Status: She is alert and oriented to person, place, and time. Mental status is at baseline.  Psychiatric:        Mood and Affect: Mood normal.        Behavior: Behavior normal.        Thought Content: Thought content normal.    LABS:    Latest Reference Range & Units 05/31/23 00:00 05/31/23 09:09  Iron 28 - 170 ug/dL  35  UIBC ug/dL  789  TIBC 381 - 017 ug/dL  510  Saturation Ratios 10.4 - 31.8 %  9 (L)  Ferritin 11 - 307 ng/mL  7 (L)  WBC  4.5 (E)   RBC 3.87 - 5.11  4.25 (E)   Hemoglobin 12.0 - 16.0  11.7 ! (E)   HCT 36 - 46  35 ! (E)   Platelets 150 - 400 K/uL 259 (E)   NEUT#  1.98 (E)   (L): Data is abnormally  Data is abnormal (E): External lab result   ASSESSMENT & PLAN:  A 28 y.o. female with iron deficiency anemia.  Although better from her recent IV iron, her hemoglobin and iron parameters remain suboptimal.  This is likely due to her persistently heavy menstrual cycles.  I will arrange for her to receive another course of IV iron in the forthcoming weeks.  She knows it remains important to follow with gynecology to do whatever necessary to reduce the severity of her menstrual cycles.  Otherwise, I will see her back in 3 months to reassess her iron and hemoglobin levels to see how well she responded to her upcoming IV iron.  The patient understands all the plans discussed today and is in agreement with them.  Clancy Leiner Kirby Funk, MD

## 2023-10-26 ENCOUNTER — Inpatient Hospital Stay: Payer: Medicaid Other | Admitting: Oncology

## 2023-10-26 ENCOUNTER — Inpatient Hospital Stay: Payer: Medicaid Other

## 2023-12-20 ENCOUNTER — Telehealth: Payer: Self-pay | Admitting: Oncology

## 2023-12-20 NOTE — Telephone Encounter (Signed)
 12/20/23 LVM to reschedule appts.

## 2024-01-03 ENCOUNTER — Other Ambulatory Visit: Payer: Self-pay | Admitting: Oncology

## 2024-01-03 DIAGNOSIS — D500 Iron deficiency anemia secondary to blood loss (chronic): Secondary | ICD-10-CM

## 2024-01-03 NOTE — Progress Notes (Unsigned)
 [facility]  [address] [place],  [place]  [address] [phone]  Clinic Day:  9/252024  Referring physician: Spero Dye, PA-C   HISTORY OF PRESENT ILLNESS:  The patient is a 28 y.o. female with iron deficiency anemia secondary to heavy menstrual cycles.  She comes in today to review her iron and hemoglobin levels .  Her last course of IV iron was in October 2024.  Although she feels better, her cycles remain very heavy.  She claims her cycles still have clots within them.  She continues to deny having other over forms of blood loss.    PHYSICAL EXAM:  There were no vitals taken for this visit. Wt Readings from Last 3 Encounters:  05/31/23 233 lb 4.8 oz (105.8 kg)  02/28/23 230 lb 4.8 oz (104.5 kg)  11/30/22 216 lb (98 kg)   There is no height or weight on file to calculate BMI. Performance status (ECOG): 0 - Asymptomatic Physical Exam Constitutional:      Appearance: Normal appearance. She is not ill-appearing.  HENT:     Mouth/Throat:     Mouth: Mucous membranes are moist.     Pharynx: Oropharynx is clear. No oropharyngeal exudate or posterior oropharyngeal erythema.  Cardiovascular:     Rate and Rhythm: Normal rate and regular rhythm.     Heart sounds: No murmur heard.    No friction rub. No gallop.  Pulmonary:     Effort: Pulmonary effort is normal. No respiratory distress.     Breath sounds: Normal breath sounds. No wheezing, rhonchi or rales.  Abdominal:     General: Bowel sounds are normal. There is no distension.     Palpations: Abdomen is soft. There is no mass.     Tenderness: There is no abdominal tenderness.  Musculoskeletal:        General: No swelling.     Right lower leg: No edema.     Left lower leg: No edema.  Lymphadenopathy:     Cervical: No cervical adenopathy.     Upper Body:     Right upper body: No supraclavicular or axillary adenopathy.     Left upper body: No supraclavicular or axillary adenopathy.      Lower Body: No right inguinal adenopathy. No left inguinal adenopathy.  Skin:    General: Skin is warm.     Coloration: Skin is not jaundiced.     Findings: No lesion or rash.  Neurological:     General: No focal deficit present.     Mental Status: She is alert and oriented to person, place, and time. Mental status is at baseline.  Psychiatric:        Mood and Affect: Mood normal.        Behavior: Behavior normal.        Thought Content: Thought content normal.   LABS:    Latest Reference Range & Units 05/31/23 00:00 05/31/23 09:09  Iron 28 - 170 ug/dL  35  UIBC ug/dL  951  TIBC 884 - 166 ug/dL  063  Saturation Ratios 10.4 - 31.8 %  9 (L)  Ferritin 11 - 307 ng/mL  7 (L)  WBC  4.5 (E)   RBC 3.87 - 5.11  4.25 (E)   Hemoglobin 12.0 - 16.0  11.7 ! (E)   HCT 36 - 46  35 ! (E)   Platelets 150 - 400 K/uL 259 (E)   NEUT#  1.98 (E)   (L): Data is  abnormally low !: Data is abnormal (E): External lab result   ASSESSMENT & PLAN:  A 28 y.o. female with iron deficiency anemia.  Although better from her recent IV iron, her hemoglobin and iron parameters remain suboptimal.  This is likely due to her persistently heavy menstrual cycles.  I will arrange for her to receive another course of IV iron in the forthcoming weeks.  She knows it remains important to follow with gynecology to do whatever necessary to reduce the severity of her menstrual cycles.  Otherwise, I will see her back in 3 months to reassess her iron and hemoglobin levels to see how well she responded to her upcoming IV iron.  The patient understands all the plans discussed today and is in agreement with them.  Diamonds Lippard Felicia Horde, MD

## 2024-01-04 ENCOUNTER — Inpatient Hospital Stay

## 2024-01-04 ENCOUNTER — Inpatient Hospital Stay: Attending: Oncology | Admitting: Oncology

## 2024-01-04 ENCOUNTER — Other Ambulatory Visit: Payer: Self-pay | Admitting: Oncology

## 2024-01-04 ENCOUNTER — Other Ambulatory Visit: Payer: Self-pay

## 2024-01-04 VITALS — BP 134/87 | HR 70 | Temp 97.8°F | Resp 14 | Ht 66.0 in | Wt 231.9 lb

## 2024-01-04 DIAGNOSIS — N920 Excessive and frequent menstruation with regular cycle: Secondary | ICD-10-CM | POA: Insufficient documentation

## 2024-01-04 DIAGNOSIS — D500 Iron deficiency anemia secondary to blood loss (chronic): Secondary | ICD-10-CM | POA: Insufficient documentation

## 2024-01-04 LAB — IRON AND TIBC
Iron: 30 ug/dL (ref 28–170)
Saturation Ratios: 6 % — ABNORMAL LOW (ref 10.4–31.8)
TIBC: 493 ug/dL — ABNORMAL HIGH (ref 250–450)
UIBC: 463 ug/dL

## 2024-01-04 LAB — CBC WITH DIFFERENTIAL (CANCER CENTER ONLY)
Abs Immature Granulocytes: 0.01 10*3/uL (ref 0.00–0.07)
Basophils Absolute: 0.1 10*3/uL (ref 0.0–0.1)
Basophils Relative: 1 %
Eosinophils Absolute: 0.1 10*3/uL (ref 0.0–0.5)
Eosinophils Relative: 1 %
HCT: 34.8 % — ABNORMAL LOW (ref 36.0–46.0)
Hemoglobin: 11.3 g/dL — ABNORMAL LOW (ref 12.0–15.0)
Immature Granulocytes: 0 %
Lymphocytes Relative: 36 %
Lymphs Abs: 2.7 10*3/uL (ref 0.7–4.0)
MCH: 24.9 pg — ABNORMAL LOW (ref 26.0–34.0)
MCHC: 32.5 g/dL (ref 30.0–36.0)
MCV: 76.8 fL — ABNORMAL LOW (ref 80.0–100.0)
Monocytes Absolute: 0.4 10*3/uL (ref 0.1–1.0)
Monocytes Relative: 6 %
Neutro Abs: 4.3 10*3/uL (ref 1.7–7.7)
Neutrophils Relative %: 56 %
Platelet Count: 393 10*3/uL (ref 150–400)
RBC: 4.53 MIL/uL (ref 3.87–5.11)
RDW: 15.7 % — ABNORMAL HIGH (ref 11.5–15.5)
WBC Count: 7.5 10*3/uL (ref 4.0–10.5)
nRBC: 0 % (ref 0.0–0.2)
nRBC: 0 /100{WBCs}

## 2024-01-04 LAB — VITAMIN B12: Vitamin B-12: 748 pg/mL (ref 180–914)

## 2024-01-04 LAB — FOLATE: Folate: 17.9 ng/mL (ref >5.9)

## 2024-01-04 LAB — FERRITIN: Ferritin: 6 ng/mL — ABNORMAL LOW (ref 11–307)

## 2024-01-07 ENCOUNTER — Encounter: Payer: Self-pay | Admitting: Oncology

## 2024-01-08 ENCOUNTER — Telehealth: Payer: Self-pay | Admitting: Oncology

## 2024-01-08 ENCOUNTER — Telehealth: Payer: Self-pay

## 2024-01-08 ENCOUNTER — Encounter: Payer: Self-pay | Admitting: Oncology

## 2024-01-08 NOTE — Telephone Encounter (Signed)
 Latest Reference Range & Units 01/04/24 15:54 01/04/24 15:55  Iron 28 - 170 ug/dL 30    UIBC ug/dL 841    TIBC 660 - 630 ug/dL 160 (H)    Saturation Ratios 10.4 - 31.8 % 6 (L)    Ferritin 11 - 307 ng/mL 6 (L)    Folate >5.9 ng/mL   17.9  Vitamin B12 180 - 914 pg/mL   748  (H): Data is abnormally high (L): Data is abnormally low ASSESSMENT & PLAN:  A 28 y.o. female whose labs today are consistent with recurrent iron deficiency anemia.  Based upon this, I will arrange for her to receive IV iron over these next few weeks to rapidly replenish her iron stores and improve  her hemoglobin.  She knows her iron deficiency anemia is likely related to her persistently heavy menstrual cycles.  She knows it remains important to follow with gynecology to discuss taking whatever measures to reduce the severity of her menstrual cycles.  Otherwise, I will see her back in 3 months to reassess her iron and hemoglobin levels to see how well she responded to her upcoming IV iron.  The patient understands all the plans discussed today and is in agreement with them.   Melinda Fetch, MD                Electronically signed by Melinda Fetch, MD at 01/07/2024  7:34 PM

## 2024-01-08 NOTE — Telephone Encounter (Signed)
 Patient has been scheduled. Aware of appt date and time.

## 2024-01-08 NOTE — Telephone Encounter (Signed)
 Contacted pt to schedule an appt. Unable to reach via phone, voicemail was left.    Scheduling Message Entered by [place], AMY W on 01/08/2024 at  2:28 PM Priority: High INFUSION 1HR30MIN (90)  Department: [facility]  Provider: Deloria Fetch, MD  Appointment Notes:  Needs IV iron

## 2024-01-10 ENCOUNTER — Inpatient Hospital Stay

## 2024-01-10 VITALS — BP 119/75 | HR 65 | Temp 98.3°F | Resp 18

## 2024-01-10 DIAGNOSIS — D500 Iron deficiency anemia secondary to blood loss (chronic): Secondary | ICD-10-CM

## 2024-01-10 MED ORDER — SODIUM CHLORIDE 0.9 % IV SOLN: INTRAVENOUS | Status: DC | PRN

## 2024-01-10 MED ORDER — SODIUM CHLORIDE 0.9 % IV SOLN
510.0000 mg | Freq: Once | INTRAVENOUS | Status: AC
  Administered 2024-01-10: 510 mg via INTRAVENOUS
  Filled 2024-01-10: qty 510

## 2024-01-10 NOTE — Patient Instructions (Signed)

## 2024-01-16 ENCOUNTER — Inpatient Hospital Stay

## 2024-01-16 VITALS — BP 116/79 | HR 70 | Temp 98.5°F | Resp 20

## 2024-01-16 DIAGNOSIS — D500 Iron deficiency anemia secondary to blood loss (chronic): Secondary | ICD-10-CM | POA: Diagnosis not present

## 2024-01-16 DIAGNOSIS — D509 Iron deficiency anemia, unspecified: Secondary | ICD-10-CM

## 2024-01-16 MED ORDER — SODIUM CHLORIDE 0.9 % IV SOLN: INTRAVENOUS | Status: DC

## 2024-01-16 MED ORDER — SODIUM CHLORIDE 0.9 % IV SOLN
510.0000 mg | Freq: Once | INTRAVENOUS | Status: AC
  Administered 2024-01-16: 510 mg via INTRAVENOUS
  Filled 2024-01-16: qty 510

## 2024-01-16 NOTE — Patient Instructions (Signed)

## 2024-01-17 ENCOUNTER — Ambulatory Visit

## 2024-04-05 ENCOUNTER — Ambulatory Visit: Admitting: Oncology

## 2024-04-05 ENCOUNTER — Other Ambulatory Visit

## 2024-04-08 NOTE — Progress Notes (Deleted)
 [facility]  [address] [place],  [place]  [address] [phone]  Clinic Day:  01/04/2024  Referring physician: Hunter Mickey Browner, PA-C   HISTORY OF PRESENT ILLNESS:  The patient is a 28 y.o. female with iron deficiency anemia secondary to heavy menstrual cycles.  She comes in today to review her iron and hemoglobin levels .  Her last course of IV iron was in October 2024.  Although she feels better, her cycles remain very heavy.  She claims her cycles still have clots within them.  She continues to deny having other over forms of blood loss.    PHYSICAL EXAM:  There were no vitals taken for this visit. Wt Readings from Last 3 Encounters:  01/04/24 231 lb 14.4 oz (105.2 kg)  05/31/23 233 lb 4.8 oz (105.8 kg)  02/28/23 230 lb 4.8 oz (104.5 kg)   There is no height or weight on file to calculate BMI. Performance status (ECOG): 0 - Asymptomatic Physical Exam Constitutional:      Appearance: Normal appearance. She is not ill-appearing.  HENT:     Mouth/Throat:     Mouth: Mucous membranes are moist.     Pharynx: Oropharynx is clear. No oropharyngeal exudate or posterior oropharyngeal erythema.  Cardiovascular:     Rate and Rhythm: Normal rate and regular rhythm.     Heart sounds: No murmur heard.    No friction rub. No gallop.  Pulmonary:     Effort: Pulmonary effort is normal. No respiratory distress.     Breath sounds: Normal breath sounds. No wheezing, rhonchi or rales.  Abdominal:     General: Bowel sounds are normal. There is no distension.     Palpations: Abdomen is soft. There is no mass.     Tenderness: There is no abdominal tenderness.  Musculoskeletal:        General: No swelling.     Right lower leg: No edema.     Left lower leg: No edema.  Lymphadenopathy:     Cervical: No cervical adenopathy.     Upper Body:     Right upper body: No supraclavicular or axillary adenopathy.     Left upper body: No supraclavicular or axillary  adenopathy.     Lower Body: No right inguinal adenopathy. No left inguinal adenopathy.  Skin:    General: Skin is warm.     Coloration: Skin is not jaundiced.     Findings: No lesion or rash.  Neurological:     General: No focal deficit present.     Mental Status: She is alert and oriented to person, place, and time. Mental status is at baseline.  Psychiatric:        Mood and Affect: Mood normal.        Behavior: Behavior normal.        Thought Content: Thought content normal.    LABS:    Latest Reference Range & Units 01/04/24 15:55  WBC 4.0 - 10.5 K/uL 7.5  RBC 3.87 - 5.11 MIL/uL 4.53  Hemoglobin 12.0 - 15.0 g/dL 88.6 (L)  HCT 63.9 - 53.9 % 34.8 (L)  MCV 80.0 - 100.0 fL 76.8 (L)  MCH 26.0 - 34.0 pg 24.9 (L)  MCHC 30.0 - 36.0 g/dL 67.4  RDW 88.4 - 84.4 % 15.7 (H)  Platelets 150 - 400 K/uL 393  nRBC 0.0 - 0.2 % 0 /100 WBC 0.00 0  Neutrophils % 56  Lymphocytes % 36  Monocytes Relative % 6  Eosinophil % 1  Basophil % 1  Immature Granulocytes % 0  (L): Data is abnormally low (H): Data is abnormally high   Latest Reference Range & Units 01/04/24 15:54 01/04/24 15:55  Iron 28 - 170 ug/dL 30   UIBC ug/dL 536   TIBC 749 - 549 ug/dL 506 (H)   Saturation Ratios 10.4 - 31.8 % 6 (L)   Ferritin 11 - 307 ng/mL 6 (L)   Folate >5.9 ng/mL  17.9  Vitamin B12 180 - 914 pg/mL  748  (H): Data is abnormally high (L): Data is abnormally low ASSESSMENT & PLAN:  A 28 y.o. female whose labs today are consistent with recurrent iron deficiency anemia.  Based upon this, I will arrange for her to receive IV iron over these next few weeks to rapidly replenish her iron stores and improve  her hemoglobin.  She knows her iron deficiency anemia is likely related to her persistently heavy menstrual cycles.  She knows it remains important to follow with gynecology to discuss taking whatever measures to reduce the severity of her menstrual cycles.  Otherwise, I will see her back in 3 months to reassess  her iron and hemoglobin levels to see how well she responded to her upcoming IV iron.  The patient understands all the plans discussed today and is in agreement with them.  Prosperity Darrough DELENA Kerns, MD

## 2024-04-09 ENCOUNTER — Inpatient Hospital Stay: Admitting: Oncology

## 2024-04-09 ENCOUNTER — Inpatient Hospital Stay

## 2024-04-29 NOTE — Progress Notes (Unsigned)
 [facility]  [address] [place],  [place]  [address] [phone]  Clinic Day:  04/09/2024  Referring physician: Hunter Mickey Browner, PA-C   HISTORY OF PRESENT ILLNESS:  The patient is a 28 y.o. female with iron deficiency anemia secondary to heavy menstrual cycles.  She comes in today to review her iron and hemoglobin levels .  Her last course of IV iron was in October 2024.  Although she feels better, her cycles remain very heavy.  She claims her cycles still have clots within them.  She continues to deny having other over forms of blood loss.    PHYSICAL EXAM:  There were no vitals taken for this visit. Wt Readings from Last 3 Encounters:  01/04/24 231 lb 14.4 oz (105.2 kg)  05/31/23 233 lb 4.8 oz (105.8 kg)  02/28/23 230 lb 4.8 oz (104.5 kg)   There is no height or weight on file to calculate BMI. Performance status (ECOG): 0 - Asymptomatic Physical Exam Constitutional:      Appearance: Normal appearance. She is not ill-appearing.  HENT:     Mouth/Throat:     Mouth: Mucous membranes are moist.     Pharynx: Oropharynx is clear. No oropharyngeal exudate or posterior oropharyngeal erythema.  Cardiovascular:     Rate and Rhythm: Normal rate and regular rhythm.     Heart sounds: No murmur heard.    No friction rub. No gallop.  Pulmonary:     Effort: Pulmonary effort is normal. No respiratory distress.     Breath sounds: Normal breath sounds. No wheezing, rhonchi or rales.  Abdominal:     General: Bowel sounds are normal. There is no distension.     Palpations: Abdomen is soft. There is no mass.     Tenderness: There is no abdominal tenderness.  Musculoskeletal:        General: No swelling.     Right lower leg: No edema.     Left lower leg: No edema.  Lymphadenopathy:     Cervical: No cervical adenopathy.     Upper Body:     Right upper body: No supraclavicular or axillary adenopathy.     Left upper body: No supraclavicular or axillary  adenopathy.     Lower Body: No right inguinal adenopathy. No left inguinal adenopathy.  Skin:    General: Skin is warm.     Coloration: Skin is not jaundiced.     Findings: No lesion or rash.  Neurological:     General: No focal deficit present.     Mental Status: She is alert and oriented to person, place, and time. Mental status is at baseline.  Psychiatric:        Mood and Affect: Mood normal.        Behavior: Behavior normal.        Thought Content: Thought content normal.    LABS:    Latest Reference Range & Units 01/04/24 15:55  WBC 4.0 - 10.5 K/uL 7.5  RBC 3.87 - 5.11 MIL/uL 4.53  Hemoglobin 12.0 - 15.0 g/dL 88.6 (L)  HCT 63.9 - 53.9 % 34.8 (L)  MCV 80.0 - 100.0 fL 76.8 (L)  MCH 26.0 - 34.0 pg 24.9 (L)  MCHC 30.0 - 36.0 g/dL 67.4  RDW 88.4 - 84.4 % 15.7 (H)  Platelets 150 - 400 K/uL 393  nRBC 0.0 - 0.2 % 0 /100 WBC 0.00 0  Neutrophils % 56  Lymphocytes % 36  Monocytes Relative % 6  Eosinophil % 1  Basophil % 1  Immature Granulocytes % 0  (L): Data is abnormally low (H): Data is abnormally high   Latest Reference Range & Units 01/04/24 15:54 01/04/24 15:55  Iron 28 - 170 ug/dL 30   UIBC ug/dL 536   TIBC 749 - 549 ug/dL 506 (H)   Saturation Ratios 10.4 - 31.8 % 6 (L)   Ferritin 11 - 307 ng/mL 6 (L)   Folate >5.9 ng/mL  17.9  Vitamin B12 180 - 914 pg/mL  748  (H): Data is abnormally high (L): Data is abnormally low ASSESSMENT & PLAN:  A 28 y.o. female whose labs today are consistent with recurrent iron deficiency anemia.  Based upon this, I will arrange for her to receive IV iron over these next few weeks to rapidly replenish her iron stores and improve  her hemoglobin.  She knows her iron deficiency anemia is likely related to her persistently heavy menstrual cycles.  She knows it remains important to follow with gynecology to discuss taking whatever measures to reduce the severity of her menstrual cycles.  Otherwise, I will see her back in 3 months to reassess  her iron and hemoglobin levels to see how well she responded to her upcoming IV iron.  The patient understands all the plans discussed today and is in agreement with them.  Melinda Powell Melinda Kerns, MD

## 2024-04-30 ENCOUNTER — Inpatient Hospital Stay: Attending: Oncology

## 2024-04-30 ENCOUNTER — Inpatient Hospital Stay (HOSPITAL_BASED_OUTPATIENT_CLINIC_OR_DEPARTMENT_OTHER): Admitting: Oncology

## 2024-04-30 ENCOUNTER — Encounter: Payer: Self-pay | Admitting: Oncology

## 2024-04-30 ENCOUNTER — Telehealth: Payer: Self-pay | Admitting: Oncology

## 2024-04-30 ENCOUNTER — Other Ambulatory Visit: Payer: Self-pay | Admitting: Oncology

## 2024-04-30 VITALS — BP 134/79 | HR 66 | Temp 98.0°F | Resp 14 | Ht 66.0 in | Wt 234.6 lb

## 2024-04-30 DIAGNOSIS — D500 Iron deficiency anemia secondary to blood loss (chronic): Secondary | ICD-10-CM | POA: Diagnosis not present

## 2024-04-30 DIAGNOSIS — N920 Excessive and frequent menstruation with regular cycle: Secondary | ICD-10-CM | POA: Diagnosis present

## 2024-04-30 DIAGNOSIS — D509 Iron deficiency anemia, unspecified: Secondary | ICD-10-CM

## 2024-04-30 LAB — CBC WITH DIFFERENTIAL (CANCER CENTER ONLY)
Abs Immature Granulocytes: 0.01 K/uL (ref 0.00–0.07)
Basophils Absolute: 0 K/uL (ref 0.0–0.1)
Basophils Relative: 1 %
Eosinophils Absolute: 0.1 K/uL (ref 0.0–0.5)
Eosinophils Relative: 1 %
HCT: 33.9 % — ABNORMAL LOW (ref 36.0–46.0)
Hemoglobin: 10.8 g/dL — ABNORMAL LOW (ref 12.0–15.0)
Immature Granulocytes: 0 %
Lymphocytes Relative: 38 %
Lymphs Abs: 2 K/uL (ref 0.7–4.0)
MCH: 25.4 pg — ABNORMAL LOW (ref 26.0–34.0)
MCHC: 31.9 g/dL (ref 30.0–36.0)
MCV: 79.6 fL — ABNORMAL LOW (ref 80.0–100.0)
Monocytes Absolute: 0.3 K/uL (ref 0.1–1.0)
Monocytes Relative: 6 %
Neutro Abs: 2.9 K/uL (ref 1.7–7.7)
Neutrophils Relative %: 54 %
Platelet Count: 326 K/uL (ref 150–400)
RBC: 4.26 MIL/uL (ref 3.87–5.11)
RDW: 15.2 % (ref 11.5–15.5)
WBC Count: 5.3 K/uL (ref 4.0–10.5)
nRBC: 0 % (ref 0.0–0.2)

## 2024-04-30 LAB — IRON AND TIBC
Iron: 28 ug/dL (ref 28–170)
Saturation Ratios: 7 % — ABNORMAL LOW (ref 10.4–31.8)
TIBC: 405 ug/dL (ref 250–450)
UIBC: 378 ug/dL

## 2024-04-30 LAB — FERRITIN: Ferritin: 6 ng/mL — ABNORMAL LOW (ref 11–307)

## 2024-04-30 NOTE — Progress Notes (Addendum)
 [facility] [address] [place],  [place]  [address] [phone]  Clinic Day:  04/30/2024  Referring physician: Hunter Mickey Browner, PA-C   HISTORY OF PRESENT ILLNESS:  The patient is a 28 y.o. female with iron deficiency anemia secondary to heavy menstrual cycles.  She comes in today to review her iron and hemoglobin levels .  Her last course of IV iron was in May 2025.  Although she feels fine, her cycles remain very heavy.  Her cycles usually last 7 days at a time, with 3 of them being particularly heavy.  She continues to deny having other over forms of blood loss.     PHYSICAL EXAM:  Blood pressure 134/79, pulse 66, temperature 98 F (36.7 C), temperature source Oral, resp. rate 14, height 5' 6 (1.676 m), weight 234 lb 9.6 oz (106.4 kg), SpO2 99%. Wt Readings from Last 3 Encounters:  04/30/24 234 lb 9.6 oz (106.4 kg)  01/04/24 231 lb 14.4 oz (105.2 kg)  05/31/23 233 lb 4.8 oz (105.8 kg)   Body mass index is 37.87 kg/m. Performance status (ECOG): 0 - Asymptomatic Physical Exam Constitutional:      Appearance: Normal appearance. She is not ill-appearing.  HENT:     Mouth/Throat:     Mouth: Mucous membranes are moist.     Pharynx: Oropharynx is clear. No oropharyngeal exudate or posterior oropharyngeal erythema.  Cardiovascular:     Rate and Rhythm: Normal rate and regular rhythm.     Heart sounds: No murmur heard.    No friction rub. No gallop.  Pulmonary:     Effort: Pulmonary effort is normal. No respiratory distress.     Breath sounds: Normal breath sounds. No wheezing, rhonchi or rales.  Abdominal:     General: Bowel sounds are normal. There is no distension.     Palpations: Abdomen is soft. There is no mass.     Tenderness: There is no abdominal tenderness.  Musculoskeletal:        General: No swelling.     Right lower leg: No edema.     Left lower leg: No edema.  Lymphadenopathy:     Cervical: No cervical adenopathy.      Upper Body:     Right upper body: No supraclavicular or axillary adenopathy.     Left upper body: No supraclavicular or axillary adenopathy.     Lower Body: No right inguinal adenopathy. No left inguinal adenopathy.  Skin:    General: Skin is warm.     Coloration: Skin is not jaundiced.     Findings: No lesion or rash.  Neurological:     General: No focal deficit present.     Mental Status: She is alert and oriented to person, place, and time. Mental status is at baseline.  Psychiatric:        Mood and Affect: Mood normal.        Behavior: Behavior normal.        Thought Content: Thought content normal.     LABS:      Latest Ref Rng & Units 04/30/2024    8:52 AM 01/04/2024    3:55 PM 05/31/2023   12:00 AM  CBC  WBC 4.0 - 10.5 K/uL 5.3  7.5  4.5      Hemoglobin 12.0 - 15.0 g/dL 89.1  88.6  88.2      Hematocrit 36.0 - 46.0 % 33.9  34.8  35      Platelets 150 -  400 K/uL 326  393  259         This result is from an external source.      Latest Ref Rng & Units 08/12/2018    8:03 PM 01/18/2018   12:43 PM 10/27/2017    7:49 AM  CMP  Glucose 70 - 99 mg/dL 88  79  899   BUN 6 - 20 mg/dL 18  15  19    Creatinine 0.44 - 1.00 mg/dL 9.24  9.30  9.27   Sodium 135 - 145 mmol/L 137  143  140   Potassium 3.5 - 5.1 mmol/L 3.9  4.2  3.6   Chloride 98 - 111 mmol/L 104  110  108   CO2 22 - 32 mmol/L 23  25  20    Calcium 8.9 - 10.3 mg/dL 9.0  9.0  9.1   Total Protein 6.5 - 8.1 g/dL 6.5  6.9  7.1   Total Bilirubin 0.3 - 1.2 mg/dL 0.6  0.3  0.3   Alkaline Phos 38 - 126 U/L 33  41  50   AST 15 - 41 U/L 14  12  15    ALT 0 - 44 U/L 10  10  12      Latest Reference Range & Units 04/30/24 08:52  Iron 28 - 170 ug/dL 28  UIBC ug/dL 621  TIBC 749 - 549 ug/dL 594  Saturation Ratios 10.4 - 31.8 % 7 (L)  Ferritin 11 - 307 ng/mL 6 (L)  (L): Data is abnormally low  ASSESSMENT & PLAN:  Assessment/Plan:  A 28 y.o. female with a history of iron deficiency anemia secondary to heavy menstrual cycles.   Despite receiving IV iron use 3 months ago, her hemoglobin is actually lower today than what it was previously.  This likely reflects a persistence of her heavy menstrual cycles.  Her iron parameters still show evidence of persistent iron deficiency anemia.  Based upon this, I will arrange for her to receive another course of IV iron over these next few weeks to rapidly replenish her iron stores and improve her hemoglobin.  She has already been seen by her gynecologist; my hope is that she could be placed on some form of oral contraception that can better normalize her cycles.  As mentioned previously, she has noticed no other forms of blood loss.  I will see her back in 2 months to see how well she responded to her upcoming course of IV iron..The patient understands all the plans discussed today and is in agreement with them.    Melinda Powell Melinda Kerns, MD

## 2024-04-30 NOTE — Telephone Encounter (Signed)
 Patient has been scheduled for follow-up visit per 04/26/24 LOS.  LVM notifying pt of appt details, provided my direct number to pt if appt changes need to be made.

## 2024-05-01 ENCOUNTER — Telehealth: Payer: Self-pay | Admitting: Oncology

## 2024-05-01 NOTE — Telephone Encounter (Signed)
 Patient has been scheduled for follow-up visit per 05/01/24 LOS.  Pt aware of scheduled appt details.

## 2024-05-09 ENCOUNTER — Inpatient Hospital Stay: Attending: Oncology

## 2024-05-09 VITALS — BP 136/84 | HR 70 | Temp 98.0°F | Resp 18

## 2024-05-09 DIAGNOSIS — N920 Excessive and frequent menstruation with regular cycle: Secondary | ICD-10-CM | POA: Insufficient documentation

## 2024-05-09 DIAGNOSIS — D500 Iron deficiency anemia secondary to blood loss (chronic): Secondary | ICD-10-CM | POA: Insufficient documentation

## 2024-05-09 MED ORDER — SODIUM CHLORIDE 0.9 % IV SOLN
510.0000 mg | Freq: Once | INTRAVENOUS | Status: AC
  Administered 2024-05-09: 510 mg via INTRAVENOUS
  Filled 2024-05-09: qty 510

## 2024-05-09 MED ORDER — SODIUM CHLORIDE 0.9 % IV SOLN
INTRAVENOUS | Status: DC
Start: 2024-05-09 — End: 2024-05-09

## 2024-05-09 NOTE — Patient Instructions (Signed)

## 2024-05-16 ENCOUNTER — Inpatient Hospital Stay

## 2024-05-16 VITALS — BP 129/66 | HR 77 | Temp 98.3°F | Resp 18

## 2024-05-16 DIAGNOSIS — D500 Iron deficiency anemia secondary to blood loss (chronic): Secondary | ICD-10-CM | POA: Diagnosis not present

## 2024-05-16 MED ORDER — SODIUM CHLORIDE 0.9 % IV SOLN: INTRAVENOUS | Status: DC

## 2024-05-16 MED ORDER — SODIUM CHLORIDE 0.9 % IV SOLN
510.0000 mg | Freq: Once | INTRAVENOUS | Status: AC
  Administered 2024-05-16: 510 mg via INTRAVENOUS
  Filled 2024-05-16: qty 510

## 2024-05-16 NOTE — Patient Instructions (Signed)

## 2024-07-01 ENCOUNTER — Other Ambulatory Visit: Payer: Self-pay

## 2024-07-01 ENCOUNTER — Encounter: Payer: Self-pay | Admitting: Hematology and Oncology

## 2024-07-01 ENCOUNTER — Inpatient Hospital Stay: Admitting: Hematology and Oncology

## 2024-07-01 ENCOUNTER — Telehealth: Payer: Self-pay | Admitting: Hematology and Oncology

## 2024-07-01 ENCOUNTER — Inpatient Hospital Stay: Attending: Oncology

## 2024-07-01 VITALS — BP 124/87 | HR 69 | Temp 98.2°F | Resp 18 | Ht 66.0 in | Wt 231.5 lb

## 2024-07-01 DIAGNOSIS — N920 Excessive and frequent menstruation with regular cycle: Secondary | ICD-10-CM | POA: Diagnosis present

## 2024-07-01 DIAGNOSIS — D509 Iron deficiency anemia, unspecified: Secondary | ICD-10-CM

## 2024-07-01 DIAGNOSIS — D500 Iron deficiency anemia secondary to blood loss (chronic): Secondary | ICD-10-CM | POA: Diagnosis present

## 2024-07-01 LAB — CBC WITH DIFFERENTIAL (CANCER CENTER ONLY)
Abs Immature Granulocytes: 0.01 K/uL (ref 0.00–0.07)
Basophils Absolute: 0.1 K/uL (ref 0.0–0.1)
Basophils Relative: 1 %
Eosinophils Absolute: 0.1 K/uL (ref 0.0–0.5)
Eosinophils Relative: 3 %
HCT: 38.2 % (ref 36.0–46.0)
Hemoglobin: 12.5 g/dL (ref 12.0–15.0)
Immature Granulocytes: 0 %
Lymphocytes Relative: 41 %
Lymphs Abs: 2.2 K/uL (ref 0.7–4.0)
MCH: 27.2 pg (ref 26.0–34.0)
MCHC: 32.7 g/dL (ref 30.0–36.0)
MCV: 83.2 fL (ref 80.0–100.0)
Monocytes Absolute: 0.4 K/uL (ref 0.1–1.0)
Monocytes Relative: 7 %
Neutro Abs: 2.7 K/uL (ref 1.7–7.7)
Neutrophils Relative %: 48 %
Platelet Count: 259 K/uL (ref 150–400)
RBC: 4.59 MIL/uL (ref 3.87–5.11)
RDW: 18.1 % — ABNORMAL HIGH (ref 11.5–15.5)
WBC Count: 5.5 K/uL (ref 4.0–10.5)
nRBC: 0 % (ref 0.0–0.2)

## 2024-07-01 LAB — IRON AND TIBC
Iron: 41 ug/dL (ref 28–170)
Saturation Ratios: 12 % (ref 10.4–31.8)
TIBC: 343 ug/dL (ref 250–450)
UIBC: 302 ug/dL

## 2024-07-01 LAB — FERRITIN: Ferritin: 77 ng/mL (ref 11–307)

## 2024-07-01 NOTE — Telephone Encounter (Signed)
 Patient has been scheduled for follow-up visit per 06/28/24 LOS.  Pt given an appt calendar with date and time.

## 2024-07-01 NOTE — Progress Notes (Cosign Needed)
 [facility]  [address] [place],  [place]  [address] [phone]  Clinic Day:  07/01/2024  Referring physician: Hunter Mickey Browner, PA-C   HISTORY OF PRESENT ILLNESS:  The patient is a 28 y.o. female with iron deficiency anemia secondary to heavy menstrual cycles. She comes in today to review her iron and hemoglobin levels. Her last course of IV iron was in September 2025.  Although she feels fine, her cycles remain very heavy. Her cycles usually last 7 days at a time, with 3 of them being particularly heavy.  With her most recent cycle this past week, she only had one heavy day and no blood clots. She continues to deny having other over forms of blood loss.  She has seen her gynecologist and they discussed treatment options of oral contraception, medication during her period or hysterectomy.  The patient decided against intervention at this time.  VITALS:   Blood pressure 124/87, pulse 69, temperature 98.2 F (36.8 C), resp. rate 18, height 5' 6 (1.676 m), weight 231 lb 8 oz (105 kg), SpO2 100%. Wt Readings from Last 3 Encounters:  07/01/24 231 lb 8 oz (105 kg)  04/30/24 234 lb 9.6 oz (106.4 kg)  01/04/24 231 lb 14.4 oz (105.2 kg)   Body mass index is 37.37 kg/m.  Performance status (ECOG): 0 - Asymptomatic  PHYSICAL EXAM:   Physical Exam Vitals and nursing note reviewed.  Constitutional:      General: She is not in acute distress.    Appearance: Normal appearance. She is not ill-appearing.  HENT:     Head: Normocephalic and atraumatic.     Mouth/Throat:     Mouth: Mucous membranes are moist.     Pharynx: Oropharynx is clear. No oropharyngeal exudate or posterior oropharyngeal erythema.  Eyes:     General: No scleral icterus.    Extraocular Movements: Extraocular movements intact.     Conjunctiva/sclera: Conjunctivae normal.     Pupils: Pupils are equal, round, and reactive to light.  Cardiovascular:     Rate and Rhythm: Normal rate and regular  rhythm.     Heart sounds: Normal heart sounds. No murmur heard.    No friction rub. No gallop.  Pulmonary:     Effort: Pulmonary effort is normal.     Breath sounds: Normal breath sounds. No wheezing, rhonchi or rales.  Abdominal:     General: There is no distension.     Palpations: Abdomen is soft. There is no hepatomegaly, splenomegaly or mass.     Tenderness: There is no abdominal tenderness.  Musculoskeletal:        General: Normal range of motion.     Cervical back: Normal range of motion and neck supple. No tenderness.     Right lower leg: No edema.     Left lower leg: No edema.  Lymphadenopathy:     Cervical: No cervical adenopathy.     Upper Body:     Right upper body: No supraclavicular or axillary adenopathy.     Left upper body: No supraclavicular or axillary adenopathy.     Lower Body: No right inguinal adenopathy. No left inguinal adenopathy.  Skin:    General: Skin is warm and dry.     Coloration: Skin is not jaundiced.     Findings: No rash.  Neurological:     Mental Status: She is alert and oriented to person, place, and time.     Cranial Nerves: No cranial nerve deficit.  Psychiatric:  Mood and Affect: Mood normal.        Behavior: Behavior normal.        Thought Content: Thought content normal.      LABS:      Latest Ref Rng & Units 07/01/2024    8:46 AM 04/30/2024    8:52 AM 01/04/2024    3:55 PM  CBC  WBC 4.0 - 10.5 K/uL 5.5  5.3  7.5   Hemoglobin 12.0 - 15.0 g/dL 87.4  89.1  88.6   Hematocrit 36.0 - 46.0 % 38.2  33.9  34.8   Platelets 150 - 400 K/uL 259  326  393       Latest Ref Rng & Units 08/12/2018    8:03 PM 01/18/2018   12:43 PM 10/27/2017    7:49 AM  CMP  Glucose 70 - 99 mg/dL 88  79  899   BUN 6 - 20 mg/dL 18  15  19    Creatinine 0.44 - 1.00 mg/dL 9.24  9.30  9.27   Sodium 135 - 145 mmol/L 137  143  140   Potassium 3.5 - 5.1 mmol/L 3.9  4.2  3.6   Chloride 98 - 111 mmol/L 104  110  108   CO2 22 - 32 mmol/L 23  25  20    Calcium  8.9 - 10.3 mg/dL 9.0  9.0  9.1   Total Protein 6.5 - 8.1 g/dL 6.5  6.9  7.1   Total Bilirubin 0.3 - 1.2 mg/dL 0.6  0.3  0.3   Alkaline Phos 38 - 126 U/L 33  41  50   AST 15 - 41 U/L 14  12  15    ALT 0 - 44 U/L 10  10  12     Lab Results  Component Value Date   TIBC 343 07/01/2024   TIBC 405 04/30/2024   TIBC 493 (H) 01/04/2024   FERRITIN 77 07/01/2024   FERRITIN 6 (L) 04/30/2024   FERRITIN 6 (L) 01/04/2024   IRONPCTSAT 12 07/01/2024   IRONPCTSAT 7 (L) 04/30/2024   IRONPCTSAT 6 (L) 01/04/2024    Review Flowsheet  More data exists      Latest Ref Rng & Units 01/04/2024 04/30/2024 07/01/2024  Oncology Labs  Ferritin 11 - 307 ng/mL 6  6  77   %SAT 10.4 - 31.8 % 6  7  12       STUDIES:   No results found.    ASSESSMENT & PLAN:   Assessment/Plan:  28 y.o. female with iron deficiency anemia due to heavy menses. She has had a good response to IV Feraheme  with normalization of her hemoglobin and iron studies. I will plan to see her back in 6 weeks for repeat clinical assessment. The patient understands all the plans discussed today and is in agreement with them.  She knows to contact our office if she develops concerns prior to her next appointment.     Andrez DELENA Foy, PA-C   Physician Assistant [facility] [place] [phone]

## 2024-07-06 ENCOUNTER — Encounter: Payer: Self-pay | Admitting: Oncology

## 2024-08-12 ENCOUNTER — Inpatient Hospital Stay

## 2024-08-12 ENCOUNTER — Inpatient Hospital Stay: Admitting: Hematology and Oncology

## 2024-08-13 ENCOUNTER — Inpatient Hospital Stay: Admitting: Hematology and Oncology

## 2024-08-13 ENCOUNTER — Other Ambulatory Visit: Payer: Self-pay

## 2024-08-13 ENCOUNTER — Inpatient Hospital Stay: Attending: Oncology

## 2024-08-13 ENCOUNTER — Telehealth: Payer: Self-pay | Admitting: Hematology and Oncology

## 2024-08-13 VITALS — BP 128/76 | HR 71 | Temp 99.2°F | Resp 16 | Ht 66.0 in | Wt 231.4 lb

## 2024-08-13 DIAGNOSIS — N920 Excessive and frequent menstruation with regular cycle: Secondary | ICD-10-CM | POA: Diagnosis present

## 2024-08-13 DIAGNOSIS — D500 Iron deficiency anemia secondary to blood loss (chronic): Secondary | ICD-10-CM | POA: Insufficient documentation

## 2024-08-13 LAB — CBC WITH DIFFERENTIAL (CANCER CENTER ONLY)
Abs Immature Granulocytes: 0.01 K/uL (ref 0.00–0.07)
Basophils Absolute: 0.1 K/uL (ref 0.0–0.1)
Basophils Relative: 1 %
Eosinophils Absolute: 0.1 K/uL (ref 0.0–0.5)
Eosinophils Relative: 1 %
HCT: 38.3 % (ref 36.0–46.0)
Hemoglobin: 12.4 g/dL (ref 12.0–15.0)
Immature Granulocytes: 0 %
Lymphocytes Relative: 34 %
Lymphs Abs: 2.3 K/uL (ref 0.7–4.0)
MCH: 27.6 pg (ref 26.0–34.0)
MCHC: 32.4 g/dL (ref 30.0–36.0)
MCV: 85.3 fL (ref 80.0–100.0)
Monocytes Absolute: 0.5 K/uL (ref 0.1–1.0)
Monocytes Relative: 8 %
Neutro Abs: 3.9 K/uL (ref 1.7–7.7)
Neutrophils Relative %: 56 %
Platelet Count: 287 K/uL (ref 150–400)
RBC: 4.49 MIL/uL (ref 3.87–5.11)
RDW: 15.5 % (ref 11.5–15.5)
WBC Count: 6.8 K/uL (ref 4.0–10.5)
nRBC: 0 % (ref 0.0–0.2)

## 2024-08-13 LAB — IRON AND TIBC
Iron: 43 ug/dL (ref 28–170)
Saturation Ratios: 11 % (ref 10.4–31.8)
TIBC: 400 ug/dL (ref 250–450)
UIBC: 357 ug/dL

## 2024-08-13 LAB — FERRITIN: Ferritin: 46 ng/mL (ref 11–307)

## 2024-08-13 NOTE — Telephone Encounter (Signed)
 Patient has been scheduled for follow-up visit per 08/13/2024 LOS.  Pt given an appt calendar with date and time.

## 2024-08-13 NOTE — Progress Notes (Unsigned)
 [facility]  [address] [place],  [place]  [address] [phone]  Clinic Day:  07/01/2024  Referring physician: Hunter Mickey Browner, PA-C   HISTORY OF PRESENT ILLNESS:  The patient is a 28 y.o. female with iron deficiency anemia secondary to heavy menstrual cycles. She comes in today for repeat iron and hemoglobin levels. Her last course of IV iron was in September 2025.  Although she feels fine, her cycles remain very heavy.  Her cycles usually last 7 days at a time, with 3 of them being particularly heavy.  She continues to deny having other over forms of blood loss.  She has seen her gynecologist and they discussed treatment options of oral contraception, medication during her period to decrease bleeding, or hysterectomy.  The patient decided against intervention at this time.  She does not tolerate oral iron due to ***  VITALS:   Blood pressure 128/76, pulse 71, temperature 99.2 F (37.3 C), temperature source Oral, resp. rate 16, height 5' 6 (1.676 m), weight 231 lb 6.4 oz (105 kg), SpO2 100%. Wt Readings from Last 3 Encounters:  08/13/24 231 lb 6.4 oz (105 kg)  07/01/24 231 lb 8 oz (105 kg)  04/30/24 234 lb 9.6 oz (106.4 kg)   Body mass index is 37.35 kg/m.  Performance status (ECOG): 0 - Asymptomatic  PHYSICAL EXAM:   Physical Exam   LABS:      Latest Ref Rng & Units 08/13/2024    8:52 AM 07/01/2024    8:46 AM 04/30/2024    8:52 AM  CBC  WBC 4.0 - 10.5 K/uL 6.8  5.5  5.3   Hemoglobin 12.0 - 15.0 g/dL 87.5  87.4  89.1   Hematocrit 36.0 - 46.0 % 38.3  38.2  33.9   Platelets 150 - 400 K/uL 287  259  326       Latest Ref Rng & Units 08/12/2018    8:03 PM 01/18/2018   12:43 PM 10/27/2017    7:49 AM  CMP  Glucose 70 - 99 mg/dL 88  79  899   BUN 6 - 20 mg/dL 18  15  19    Creatinine 0.44 - 1.00 mg/dL 9.24  9.30  9.27   Sodium 135 - 145 mmol/L 137  143  140   Potassium 3.5 - 5.1 mmol/L 3.9  4.2  3.6   Chloride 98 - 111 mmol/L 104  110  108    CO2 22 - 32 mmol/L 23  25  20    Calcium 8.9 - 10.3 mg/dL 9.0  9.0  9.1   Total Protein 6.5 - 8.1 g/dL 6.5  6.9  7.1   Total Bilirubin 0.3 - 1.2 mg/dL 0.6  0.3  0.3   Alkaline Phos 38 - 126 U/L 33  41  50   AST 15 - 41 U/L 14  12  15    ALT 0 - 44 U/L 10  10  12     Lab Results  Component Value Date   TIBC 343 07/01/2024   TIBC 405 04/30/2024   TIBC 493 (H) 01/04/2024   FERRITIN 77 07/01/2024   FERRITIN 6 (L) 04/30/2024   FERRITIN 6 (L) 01/04/2024   IRONPCTSAT 12 07/01/2024   IRONPCTSAT 7 (L) 04/30/2024   IRONPCTSAT 6 (L) 01/04/2024    Review Flowsheet  More data exists      Latest Ref Rng & Units 01/04/2024 04/30/2024 07/01/2024  Oncology Labs  Ferritin 11 - 307 ng/mL 6  6  77   %SAT 10.4 - 31.8 % 6  7  12       STUDIES:   No results found.    ASSESSMENT & PLAN:   Assessment/Plan:  28 y.o. female with iron deficiency anemia due to heavy menses.   Her hemoglobin ***.  Iron studies are pending. I will plan to see her back in *** weeks for repeat clinical assessment. The patient understands all the plans discussed today and is in agreement with them.  She knows to contact our office if she develops concerns prior to her next appointment.     Andrez DELENA Foy, PA-C   Physician Assistant [facility] [place] [phone]

## 2024-08-14 ENCOUNTER — Encounter: Payer: Self-pay | Admitting: Oncology

## 2024-08-14 ENCOUNTER — Encounter: Payer: Self-pay | Admitting: Hematology and Oncology

## 2024-08-16 ENCOUNTER — Telehealth: Payer: Self-pay

## 2024-08-16 NOTE — Telephone Encounter (Signed)
-----   Message from Andrez DELENA Foy sent at 08/14/2024  8:37 AM EST ----- Please let her know her iron stores are decreasing but still adequate. Have her try oral iron Mon, Wed, Fri. Keep appt as scheduled, call if needs to be seen sooner. Thanks

## 2024-08-16 NOTE — Telephone Encounter (Signed)
 See MyChart Message.

## 2024-10-15 ENCOUNTER — Inpatient Hospital Stay: Attending: Oncology

## 2024-10-15 ENCOUNTER — Inpatient Hospital Stay: Admitting: Hematology and Oncology
